# Patient Record
Sex: Male | Born: 1965 | Race: White | Hispanic: No | Marital: Single | Smoking: Never smoker
Health system: Southern US, Community
[De-identification: ages and names within clinical notes are randomized; demographics above are authoritative.]

## PROBLEM LIST (undated history)

## (undated) ENCOUNTER — Emergency Department (HOSPITAL_COMMUNITY): Discharge status: Absent without leave | Payer: Managed Care, Other (non HMO)

## (undated) DIAGNOSIS — E7800 Pure hypercholesterolemia, unspecified: Secondary | ICD-10-CM

## (undated) DIAGNOSIS — K219 Gastro-esophageal reflux disease without esophagitis: Secondary | ICD-10-CM

## (undated) DIAGNOSIS — I10 Essential (primary) hypertension: Secondary | ICD-10-CM

## (undated) HISTORY — PX: APPENDECTOMY: SHX54

## (undated) HISTORY — PX: SPINE SURGERY: SHX786

## (undated) HISTORY — PX: OTHER SURGICAL HISTORY: SHX169

## (undated) HISTORY — DX: Gastro-esophageal reflux disease without esophagitis: K21.9

## (undated) HISTORY — PX: TONSILLECTOMY: SUR1361

---

## 1999-03-05 ENCOUNTER — Ambulatory Visit (HOSPITAL_COMMUNITY): Admission: RE | Admit: 1999-03-05 | Discharge: 1999-03-05 | Payer: Self-pay

## 2000-02-19 ENCOUNTER — Encounter: Payer: Self-pay | Admitting: Emergency Medicine

## 2000-02-19 ENCOUNTER — Emergency Department (HOSPITAL_COMMUNITY): Admission: EM | Admit: 2000-02-19 | Discharge: 2000-02-19 | Payer: Self-pay | Admitting: Emergency Medicine

## 2000-03-05 ENCOUNTER — Emergency Department (HOSPITAL_COMMUNITY): Admission: EM | Admit: 2000-03-05 | Discharge: 2000-03-05 | Payer: Self-pay | Admitting: Emergency Medicine

## 2000-03-05 ENCOUNTER — Encounter: Payer: Self-pay | Admitting: Emergency Medicine

## 2012-01-08 ENCOUNTER — Emergency Department (HOSPITAL_COMMUNITY): Payer: No Typology Code available for payment source

## 2012-01-08 ENCOUNTER — Emergency Department (HOSPITAL_COMMUNITY)
Admission: EM | Admit: 2012-01-08 | Discharge: 2012-01-08 | Disposition: A | Discharge status: Home / Self Care | Payer: No Typology Code available for payment source | Attending: Emergency Medicine | Admitting: Emergency Medicine

## 2012-01-08 ENCOUNTER — Encounter (HOSPITAL_COMMUNITY): Payer: Self-pay | Admitting: Neurology

## 2012-01-08 DIAGNOSIS — M5412 Radiculopathy, cervical region: Secondary | ICD-10-CM | POA: Insufficient documentation

## 2012-01-08 DIAGNOSIS — M5030 Other cervical disc degeneration, unspecified cervical region: Secondary | ICD-10-CM | POA: Insufficient documentation

## 2012-01-08 DIAGNOSIS — R51 Headache: Secondary | ICD-10-CM | POA: Insufficient documentation

## 2012-01-08 DIAGNOSIS — I10 Essential (primary) hypertension: Secondary | ICD-10-CM | POA: Insufficient documentation

## 2012-01-08 DIAGNOSIS — R079 Chest pain, unspecified: Secondary | ICD-10-CM | POA: Insufficient documentation

## 2012-01-08 DIAGNOSIS — F411 Generalized anxiety disorder: Secondary | ICD-10-CM | POA: Insufficient documentation

## 2012-01-08 DIAGNOSIS — M25519 Pain in unspecified shoulder: Secondary | ICD-10-CM | POA: Insufficient documentation

## 2012-01-08 HISTORY — DX: Pure hypercholesterolemia, unspecified: E78.00

## 2012-01-08 HISTORY — DX: Essential (primary) hypertension: I10

## 2012-01-08 MED ORDER — HYDROCODONE-ACETAMINOPHEN 5-325 MG PO TABS
1.0000 | ORAL_TABLET | Freq: Once | ORAL | Status: AC
  Administered 2012-01-08: 1 via ORAL
  Filled 2012-01-08: qty 1

## 2012-01-08 MED ORDER — HYDROCODONE-ACETAMINOPHEN 5-325 MG PO TABS: 1.0000 | ORAL_TABLET | ORAL | Status: DC | PRN

## 2012-01-08 NOTE — ED Notes (Signed)
Spoke with ortho, coming down to apply soft cervical collar.
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
## 2012-01-08 NOTE — Progress Notes (Signed)
Orthopedic Tech Progress Note Patient Details:  Bryce Bush 10-11-1965 [id number]  Ortho Devices Type of Ortho Device: Soft collar Ortho Device/Splint Location: neck Ortho Device/Splint Interventions: Application   Tabor Denham 01/08/2012, 6:14 PM
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
## 2012-01-29 ENCOUNTER — Other Ambulatory Visit: Payer: Self-pay | Admitting: Emergency Medicine

## 2012-01-29 NOTE — Telephone Encounter (Signed)
Chart pulled to PA pool at nurses station [id number]

## 2012-02-26 ENCOUNTER — Other Ambulatory Visit: Payer: Self-pay | Admitting: Physician Assistant

## 2012-02-26 NOTE — Telephone Encounter (Signed)
Chart pulled to PA pool at nurses station [phone]

## 2012-03-11 ENCOUNTER — Ambulatory Visit (INDEPENDENT_AMBULATORY_CARE_PROVIDER_SITE_OTHER): Payer: Managed Care, Other (non HMO) | Admitting: Family Medicine

## 2012-03-11 VITALS — BP 124/84 | HR 68 | Temp 98.1°F | Resp 16 | Ht 69.0 in | Wt 217.0 lb

## 2012-03-11 DIAGNOSIS — E782 Mixed hyperlipidemia: Secondary | ICD-10-CM | POA: Insufficient documentation

## 2012-03-11 DIAGNOSIS — I10 Essential (primary) hypertension: Secondary | ICD-10-CM

## 2012-03-11 DIAGNOSIS — E785 Hyperlipidemia, unspecified: Secondary | ICD-10-CM

## 2012-03-11 HISTORY — DX: Mixed hyperlipidemia: E78.2

## 2012-03-11 HISTORY — DX: Essential (primary) hypertension: I10

## 2012-03-11 LAB — POCT URINALYSIS DIPSTICK
Bilirubin, UA: NEGATIVE
Glucose, UA: NEGATIVE
Ketones, UA: NEGATIVE
Leukocytes, UA: NEGATIVE
Nitrite, UA: NEGATIVE
Protein, UA: NEGATIVE
Spec Grav, UA: 1.025
Urobilinogen, UA: 0.2
pH, UA: 5.5

## 2012-03-11 LAB — COMPREHENSIVE METABOLIC PANEL
ALT: 24 U/L (ref 0–53)
AST: 19 U/L (ref 0–37)
BUN: 11 mg/dL (ref 6–23)
CO2: 29 mEq/L (ref 19–32)
Creat: 0.82 mg/dL (ref 0.50–1.35)
Total Bilirubin: 0.6 mg/dL (ref 0.3–1.2)

## 2012-03-11 LAB — LIPID PANEL
Cholesterol: 258 mg/dL — ABNORMAL HIGH (ref 0–200)
HDL: 46 mg/dL (ref >39)
LDL Cholesterol: 150 mg/dL — ABNORMAL HIGH (ref 0–99)
Total CHOL/HDL Ratio: 5.6 ratio
Triglycerides: 311 mg/dL — ABNORMAL HIGH (ref <150)
VLDL: 62 mg/dL — ABNORMAL HIGH (ref 0–40)

## 2012-03-11 LAB — COMPREHENSIVE METABOLIC PANEL WITH GFR
Albumin: 4.5 g/dL (ref 3.5–5.2)
Alkaline Phosphatase: 52 U/L (ref 39–117)
Calcium: 9.6 mg/dL (ref 8.4–10.5)
Chloride: 102 meq/L (ref 96–112)
Glucose, Bld: 81 mg/dL (ref 70–99)
Potassium: 4.6 meq/L (ref 3.5–5.3)
Sodium: 139 meq/L (ref 135–145)
Total Protein: 7.4 g/dL (ref 6.0–8.3)

## 2012-03-11 LAB — TSH: TSH: 1.735 u[IU]/mL (ref 0.350–4.500)

## 2012-03-11 MED ORDER — LISINOPRIL 20 MG PO TABS: 20.0000 mg | ORAL_TABLET | Freq: Every day | ORAL | Status: DC

## 2012-03-11 MED ORDER — PRAVASTATIN SODIUM 40 MG PO TABS: 40.0000 mg | ORAL_TABLET | Freq: Every day | ORAL | Status: DC

## 2012-03-11 NOTE — Progress Notes (Signed)
[facility]:  Office Visit  Chief Complaint:  Chief Complaint  Patient presents with  . Medication Refill    HPI: Bryce Bush is a 46 y.o. male who complains of: 1. HTN-123/72 last check, was 120s/70-80s. No SEs. Compliant. Last eye exam was nl in the last month.  2. Hyperlipidemia-Compliant, no SEs.   Past Medical History  Diagnosis Date  . Hypertension   . Hypercholesteremia    Past Surgical History  Procedure Date  . Appendectomy   . Tonsillectomy    History   Social History  . Marital Status: Single    Spouse Name: N/A    Number of Children: N/A  . Years of Education: N/A   Social History Main Topics  . Smoking status: Never Smoker   . Smokeless tobacco: Not on file  . Alcohol Use: No  . Drug Use: No  . Sexually Active:    Other Topics Concern  . Not on file   Social History Narrative  . No narrative on file   Family History  Problem Relation Age of Onset  . Cancer Father   . Heart disease Father    No Known Allergies Prior to Admission medications   Medication Sig Start Date End Date Taking? Authorizing Provider  aspirin 81 MG chewable tablet Chew 81 mg by mouth daily.   Yes Historical Provider, MD  fish oil-omega-3 fatty acids 1000 MG capsule Take 1 g by mouth daily.   Yes Historical Provider, MD  lisinopril (PRINIVIL,ZESTRIL) 20 MG tablet TAKE ONE TABLET BY MOUTH EVERY DAY. NEEDS OFFICE VISIT 02/26/12  Yes Ryan M Dunn, PA-C  pravastatin (PRAVACHOL) 40 MG tablet TAKE ONE TABLET BY MOUTH EVERY DAY. NEEDS OFFICE VISIT 02/26/12  Yes Sondra Barges, PA-C  HYDROcodone-acetaminophen (NORCO/VICODIN) 5-325 MG per tablet Take 1 tablet by mouth every 4 (four) hours as needed for pain. 01/08/12   Loren Racer, MD  LYSINE PO Take 1 tablet by mouth daily.    Historical Provider, MD  pravastatin (PRAVACHOL) 10 MG tablet Take 10 mg by mouth daily.    Historical Provider, MD     ROS: The patient denies fevers, chills, night sweats, unintentional  weight loss, chest pain, palpitations, wheezing, dyspnea on exertion, nausea, vomiting, abdominal pain, dysuria, hematuria, melena, numbness, weakness, or tingling.   All other systems have been reviewed and were otherwise negative with the exception of those mentioned in the HPI and as above.    PHYSICAL EXAM: Filed Vitals:   03/11/12 1217  BP: 124/84  Pulse: 68  Temp: 98.1 F (36.7 C)  Resp: 16   Filed Vitals:   03/11/12 1217  Height: 5\' 9"  (1.753 m)  Weight: 217 lb (98.431 kg)   Body mass index is 32.05 kg/(m^2).  General: Alert, no acute distress, obese HEENT:  Normocephalic, atraumatic, oropharynx patent. EOMI, PERRLA, fundoscopic exam nl Cardiovascular:  Regular rate and rhythm, no rubs murmurs or gallops.  No Carotid bruits, radial pulse intact. No pedal edema.  Respiratory: Clear to auscultation bilaterally.  No wheezes, rales, or rhonchi.  No cyanosis, no use of accessory musculature GI: No organomegaly, abdomen is soft and non-tender, positive bowel sounds.  No masses. Skin: No rashes. Neurologic: Facial musculature symmetric. Psychiatric: Patient is appropriate throughout our interaction. Lymphatic: No cervical lymphadenopathy Musculoskeletal: Gait intact.   LABS: No results found for this or any previous visit.   EKG/XRAY:   Primary read interpreted by Dr. Conley Rolls at [facility].   ASSESSMENT/PLAN: Encounter Diagnoses  Name  Primary?  . HTN (hypertension) Yes  . Hyperlipidemia    Weight loss advice given, portion control and increase exercise CMP and lipid panel pending Refill meds x 1 year but advise patient to f/u in 6 months for routine lab work F/u in 6 months   LE, THAO PHUONG, DO 03/11/2012 12:57 PM

## 2012-03-12 ENCOUNTER — Telehealth: Payer: Self-pay | Admitting: Family Medicine

## 2012-03-12 ENCOUNTER — Encounter: Payer: Self-pay | Admitting: Family Medicine

## 2012-03-12 ENCOUNTER — Telehealth: Payer: Self-pay

## 2012-03-12 NOTE — Telephone Encounter (Signed)
LM about labs: cholesterol high on pravachol 40 mg nightly and fish oil. Need to increase to 80 mg and recheck in 3 months. If he has been not taking it regular, which he states he has and has only missed a few doses, then he needs to start taking meds. Missing a few doses does not change cholesterol that significantly. Either way repeat lipid panel in 3 months with change in cholesterol meds/retaking of cholesterol meds.  Decrease fats, increase exercise.

## 2012-03-12 NOTE — Telephone Encounter (Signed)
Message copied by Johnnette Litter on Thu Mar 12, 2012  1:45 PM ------      Message from: Hamilton Capri P      Created: Thu Mar 12, 2012 11:30 AM       Can you pritn out the letter I already wrote him and send it to him. Under letters tab.            Thanks,      Dr. Conley Rolls

## 2012-03-12 NOTE — Telephone Encounter (Signed)
Done

## 2012-05-09 ENCOUNTER — Other Ambulatory Visit: Payer: Self-pay

## 2012-09-30 ENCOUNTER — Ambulatory Visit (INDEPENDENT_AMBULATORY_CARE_PROVIDER_SITE_OTHER): Payer: Managed Care, Other (non HMO) | Admitting: Family Medicine

## 2012-09-30 VITALS — BP 133/82 | HR 65 | Temp 97.8°F | Resp 16 | Ht 67.5 in | Wt 208.6 lb

## 2012-09-30 DIAGNOSIS — R42 Dizziness and giddiness: Secondary | ICD-10-CM

## 2012-09-30 DIAGNOSIS — I10 Essential (primary) hypertension: Secondary | ICD-10-CM

## 2012-09-30 DIAGNOSIS — E785 Hyperlipidemia, unspecified: Secondary | ICD-10-CM

## 2012-09-30 LAB — COMPREHENSIVE METABOLIC PANEL
ALT: 23 U/L (ref 0–53)
AST: 20 U/L (ref 0–37)
Albumin: 4.7 g/dL (ref 3.5–5.2)
Alkaline Phosphatase: 55 U/L (ref 39–117)
Potassium: 4.7 mEq/L (ref 3.5–5.3)
Sodium: 137 mEq/L (ref 135–145)
Total Bilirubin: 0.5 mg/dL (ref 0.3–1.2)
Total Protein: 7.6 g/dL (ref 6.0–8.3)

## 2012-09-30 LAB — COMPREHENSIVE METABOLIC PANEL WITH GFR
BUN: 16 mg/dL (ref 6–23)
CO2: 28 meq/L (ref 19–32)
Calcium: 9.7 mg/dL (ref 8.4–10.5)
Chloride: 101 meq/L (ref 96–112)
Creat: 0.88 mg/dL (ref 0.50–1.35)
Glucose, Bld: 89 mg/dL (ref 70–99)

## 2012-09-30 LAB — POCT URINALYSIS DIPSTICK
Bilirubin, UA: NEGATIVE
Glucose, UA: NEGATIVE
Ketones, UA: NEGATIVE
Leukocytes, UA: NEGATIVE
Nitrite, UA: NEGATIVE
Protein, UA: NEGATIVE
Spec Grav, UA: 1.025
Urobilinogen, UA: 0.2
pH, UA: 5.5

## 2012-09-30 LAB — POCT CBC
Granulocyte percent: 65.2 %G (ref 37–80)
HCT, POC: 47.8 % (ref 43.5–53.7)
Hemoglobin: 15.6 g/dL (ref 14.1–18.1)
Lymph, poc: 2.4 (ref 0.6–3.4)
MCH, POC: 31 pg (ref 27–31.2)
MCHC: 32.6 g/dL (ref 31.8–35.4)
MCV: 95.1 fL (ref 80–97)
MID (cbc): 0.6 (ref 0–0.9)
MPV: 7.1 fL (ref 0–99.8)
POC Granulocyte: 5.6 (ref 2–6.9)
POC LYMPH PERCENT: 27.8 %L (ref 10–50)
POC MID %: 7 %M (ref 0–12)
Platelet Count, POC: 337 10*3/uL (ref 142–424)
RBC: 5.03 M/uL (ref 4.69–6.13)
RDW, POC: 14.3 %
WBC: 8.6 10*3/uL (ref 4.6–10.2)

## 2012-09-30 LAB — LIPID PANEL
Cholesterol: 229 mg/dL — ABNORMAL HIGH (ref 0–200)
HDL: 46 mg/dL (ref >39)
LDL Cholesterol: 123 mg/dL — ABNORMAL HIGH (ref 0–99)
Total CHOL/HDL Ratio: 5 Ratio
Triglycerides: 299 mg/dL — ABNORMAL HIGH (ref <150)
VLDL: 60 mg/dL — ABNORMAL HIGH (ref 0–40)

## 2012-09-30 LAB — POCT UA - MICROSCOPIC ONLY
Bacteria, U Microscopic: NEGATIVE
Casts, Ur, LPF, POC: NEGATIVE
Crystals, Ur, HPF, POC: NEGATIVE
Epithelial cells, urine per micros: NEGATIVE
Mucus, UA: NEGATIVE
Yeast, UA: NEGATIVE

## 2012-09-30 LAB — GLUCOSE, POCT (MANUAL RESULT ENTRY): POC Glucose: 85 mg/dL (ref 70–99)

## 2012-09-30 LAB — TSH: TSH: 2.11 u[IU]/mL (ref 0.350–4.500)

## 2012-09-30 MED ORDER — LISINOPRIL 10 MG PO TABS: 10.0000 mg | ORAL_TABLET | Freq: Every day | ORAL | Status: DC

## 2012-09-30 NOTE — Patient Instructions (Signed)
2000 Calorie Diabetic Diet The 2000 calorie diabetic diet is designed for eating up to 2000 calories each day. Following this diet and making healthy meal choices can help improve overall health. It controls blood glucose (sugar) levels. It can also lower blood pressure and cholesterol. SERVING SIZES Measuring foods and serving sizes helps to make sure you are getting the right amount of food. The list below tells how big or small some common serving sizes are.  1 oz.........4 stacked dice.  3 oz.........Deck of cards.  1 tsp........Tip of little finger.  1 tbs........Thumb.  2 tbs........Golf ball.   cup.......Half of a fist.  1 cup........A fist. GUIDELINES FOR CHOOSING FOODS The goal of this diet is to eat a variety of foods and limit calories to 2000 each day. This can be done by choosing foods that are low in calories and fat. The diet also suggests eating small amounts of food often. Doing this helps control your blood glucose levels so they do not get too high or too low. Each meal or snack should contain a protein food source to help you feel more satisfied and to stabilize your blood glucose. Try to eat about the same amount of food around the same time each day. This includes weekend days, travel days, and days off work. Space your meals about 4 to 5 hours apart and add a snack between them if you wish. For example, a daily food plan could include breakfast, a morning snack, lunch, dinner, and an evening snack. Healthy meals and snacks include whole grains, vegetables, fruits, lean meats, poultry, fish, and dairy products. As you plan your meals, choose a variety of foods. Choose from the bread and starches, vegetables, fruit, dairy, and meat/protein groups. Examples of foods from each group are listed below with their suggested serving sizes. Use measuring cups and spoons to become familiar with what a healthy portion looks like. Bread and Starches Each serving equals 15 grams of  carbohydrates.  1 slice bread.   bagel.   cup or 1 cup cold cereal (unsweetened).   cup hot cereal or mashed potatoes.  1 small potato (size of a computer mouse).   cup cooked pasta or rice.   English muffin.  1 cup broth-based soup.  3 cups popcorn.  4 to 6 whole-wheat crackers.   cup cooked beans, peas, or corn. Vegetables Each serving equals 5 grams of carbohydrates.   cup cooked vegetables.  1 cup raw vegetables.   cup tomato juice. Fruit Each serving equals 15 grams of carbohydrates.  1 small apple, banana, or orange.  1  cup watermelon or strawberries.   cup applesauce (no sugar added).  2 tbs raisins.   banana.   cup unsweetened canned fruit.   cup unsweetened fruit juice. Dairy Each serving equals 12 to 15 grams of carbohydrates.  1 cup fat-free milk.  6 oz artificially sweetened yogurt.  1 cup buttermilk.  1 cup soy milk. Meat/Protein  1 large egg.  2 to 3 oz meat, poultry, or fish.   cup cottage cheese.  1 tbs peanut butter.   cup tofu.  1 oz cheese.   cup tuna canned in water. SAMPLE 2000 CALORIE DIET PLAN Breakfast  1 English muffin (2 carb servings).  Reduced fat cream cheese, 1 tbs.  1 scrambled egg.   grapefruit (1 carb serving).  Fat-free milk, 1 cup (1 carb serving). Morning Snack  Artificially sweetened yogurt, 6 oz (1 carb serving).  2 tbs chopped nuts.  1   small peach (1 carb serving). Lunch  Grilled chicken sandwich.  1 hamburger bun (2 carb servings).  2 oz chicken breast.  1 lettuce leaf.  2 slices tomato.  Reduced fat mayonnaise, 1 tbs.  Carrot sticks, 1 cup.  Celery, 1 cup.  1 small apple (1 carb serving).  Fat-free milk, 1 cup (1 carb serving). Afternoon Snack   cup low-fat cottage cheese.  1  cups strawberries (1 carb serving). Dinner  Steak fajitas.  2 oz lean steak.  1 whole-wheat tortilla, 8 inches (1  carb servings).  Shredded lettuce,   cup.  2 slices tomato.  Salsa,  cup.  Low-fat sour cream, 2 tbs.  Brown rice,  cup (1 carb serving).  1 small orange (1 carb serving). Evening Snack  4 reduced fat whole-wheat crackers (1 carb serving).  1 tbs peanut butter.  12 to 15 grapes (1 carb serving). MEAL PLAN Use this worksheet to help you make a daily meal plan based on the 2000 calorie diabetic diet suggestions. The total amount of carbohydrates in your meal or snack is more important than making sure you include all of the food groups at every meal or snack. If you are using this plan to help you control your blood glucose, you may interchange carbohydrate containing foods (dairy, starches, and fruits). Choose a variety of fresh foods of varying colors and flavors. You can choose from the following foods to build your day's meals:  11 Starches.  4 Vegetables.  3 Fruits.  3 Dairy.  8 oz Meat.  Up to 6 Fats. Your dietician can use this worksheet to help you decide how many servings and what types of foods are right for you. BREAKFAST Food Group and Servings / Food Choice Starches ___________________________________________ Dairy ______________________________________________ Fruit ______________________________________________ Meat ______________________________________________ Fat________________________________________________ LUNCH Food Group and Servings / Food Choice Starch _____________________________________________ Meat ______________________________________________ Vegetables _________________________________________ Fruit ______________________________________________ Dairy______________________________________________ Fat________________________________________________ Aura Fey Food Group and Servings / Food  Choice Starch________________________________________________ Meat_________________________________________________ Fruit__________________________________________________ Linford Arnold Group and Servings / Food Choice Starches ____________________________________________ Meat _______________________________________________ Dairy _______________________________________________ Vegetables __________________________________________ Fruit ________________________________________________ Fat_________________________________________________ Lollie Sails Food Group and Servings / Food Choice Fruit _______________________________________________ Meat _______________________________________________ Starch ______________________________________________ DAILY TOTALS Starches ________________________ Vegetables ______________________ Fruit ___________________________ Dairy ___________________________ Meat ___________________________ Fat _____________________________ Document Released: 10/01/2004 Document Revised: 06/03/2011 Document Reviewed: 10/17/2008 [employer] Patient Information 2014 [employer]. Vegetarian Diets Many foods in the vegetarian diet (nuts, legumes, seeds, whole grains, and fresh fruits and vegetables) contain large amounts of fiber, which give a feeling of fullness (satiation) after meals. Vegetarian eating plans may provide significant health benefits including lowering rates of heart disease, obesity, diabetes, cancer, and high blood pressure. A vegetarian diet is usually low in cholesterol and saturated fat due to the high amount of grains, fruits, and vegetables and the elimination of meats. In addition, a vegetarian diet may have some advantages for people with diabetes. These advantages include:  Helping to maintain normal blood glucose levels.  Promoting a healthy weight.  Improving blood glucose control and insulin response.  Reducing the risk for cardiovascular  disease. TYPES OF VEGETARIAN DIETS Vegans. These are vegetarians who consume only plant food. No animal foods of any kind are eaten. Anyone following such a diet will need to select fortified foods or take a vitamin B12 supplement. Vegans also may need a vitamin D supplement if sun exposure is limited. Some foods are fortified with vitamin D that can be chosen as well.  Lacto-vegetarians. These are vegetarians who eat dairy products (  milk, cheese, and yogurt) in addition to plant foods. They do not eat meat, poultry, fish, or eggs. Lacto-ovo vegetarians. These are vegetarians who eat plant foods, eggs, and dairy products, but do not eat meat, fish, or poultry. LIMITING PROTEINS Meals should be planned to provide adequate sources of proteins as they may be limited in an unbalanced vegetarian diet. Good sources of proteins include beans or legumes, soy products, nuts, seeds, eggs, milk, and cheese. The list below includes food sources of protein. Your Registered Dietitian can help you determine your individual protein needs.  Beans and Peas (Dried and Cooked) Each serving represents 7 to 8 grams of protein.  Black beans, 1 cup.  Black-eyed peas, 1 cup.  [place], 1 cup.  Garbanzo, chickpeas,  cup.  Kidney beans, 1 cup.  Lentils, 1 cup.  Lima beans, 1 cup.  Mung beans, 2 cups.  Navy beans,  cup.  Pinto beans,  cup.  Split peas,  cup.  Split pea soup, 1 cup. Meat Substitutes Each serving represents 7 to 8 grams of protein.  Bacon substitute, 2 tbs.  Hummus,  cup.  Soybeans, cup.  Textured vegetable protein,  oz.  Tofu (2 inch x 2 inch x 1 inch cube),  cup (4 oz). Nuts and Seeds Each serving represents 7 to 8 grams of protein.  Almonds, dry-roasted,  cup (1 oz).  [place] nuts,  cup (1 oz).  Butternuts,  cup (1 oz).  Peanuts, roasted,  cup (1 oz).  Pecans,  cup (1 oz).  Pignolias, pine nuts, 2 tbs.  Pistachios, 60 nuts (1 oz).  Pumpkin or squash seeds,   cup.  Sunflower seeds (no hull),  cup.  Sunflower seeds (with hulls),  cup.  Walnuts,  cup (1 oz). Milk/Milk Substitutes Each serving represents 7 to 8 grams of protein.  Soy milk, fortified, 1 cup.  Goat milk, 1 cup.  Kefir, 1 cup. SAMPLE MEAL PLAN - 2000 CALORIES  Carbohydrate: 276 grams (55% of total calories).  Protein: 95 grams (19% of total calories).  Fat: 60 grams (26% of total calories). Breakfast  Skim milk, 1 cup (8g*).  Orange juice,  cup.  1 slice whole-wheat toast (3g).  Flaked corn cereal,  cup (3g).  Margarine, 2 tsp. Morning snack  1 apple or 6 whole-wheat crackers (3g). Noon meal  Skim milk, 1 cup (8g).  Cottage cheese,  cup (8g).  1 medium orange.  1 pita bread filled with  cup garbanzo spread, chopped tomatoes, onions, and lettuce (15g).  Tahini sauce, 1 tsp. Afternoon snack   banana.  4 light, crispy rye crackers (3g). Evening meal  Green salad with bean sprouts (4g).  Pineapple with juice,  cup.  Spaghetti,  cup (3g).  Lentil spaghetti sauce, 1 cup (3g).  [place] dressing, 1 tbs. Evening snack  Skim milk, 1 cup (8g).  Peanuts,  cup (7g).  Popcorn (lightly salted, no butter), 3 cups (3g). * Grams of protein. Document Released: 03/14/2003 Document Revised: 06/03/2011 Document Reviewed: 08/30/2010 [facility] Patient Information 2014 [place], [place]. DASH Diet The DASH diet stands for "Dietary Approaches to Stop Hypertension." It is a healthy eating plan that has been shown to reduce high blood pressure (hypertension) in as little as 14 days, while also possibly providing other significant health benefits. These other health benefits include reducing the risk of breast cancer after menopause and reducing the risk of type 2 diabetes, heart disease, colon cancer, and stroke. Health benefits also include weight loss and slowing kidney failure in patients  with chronic kidney disease.  DIET GUIDELINES  Limit salt  (sodium). Your diet should contain less than 1500 mg of sodium daily.  Limit refined or processed carbohydrates. Your diet should include mostly whole grains. Desserts and added sugars should be used sparingly.  Include small amounts of heart-healthy fats. These types of fats include nuts, oils, and tub margarine. Limit saturated and trans fats. These fats have been shown to be harmful in the body. CHOOSING FOODS  The following food groups are based on a 2000 calorie diet. See your Registered Dietitian for individual calorie needs. Grains and Grain Products (6 to 8 servings daily)  Eat More Often: Whole-wheat bread, brown rice, whole-grain or wheat pasta, quinoa, popcorn without added fat or salt (air popped).  Eat Less Often: White bread, white pasta, white rice, cornbread. Vegetables (4 to 5 servings daily)  Eat More Often: Fresh, frozen, and canned vegetables. Vegetables may be raw, steamed, roasted, or grilled with a minimal amount of fat.  Eat Less Often/Avoid: Creamed or fried vegetables. Vegetables in a cheese sauce. Fruit (4 to 5 servings daily)  Eat More Often: All fresh, canned (in natural juice), or frozen fruits. Dried fruits without added sugar. One hundred percent fruit juice ( cup [237 mL] daily).  Eat Less Often: Dried fruits with added sugar. Canned fruit in light or heavy syrup. [employer], Fish, and Poultry (2 servings or less daily. One serving is 3 to 4 oz [85-114 g]).  Eat More Often: Ninety percent or leaner ground beef, tenderloin, sirloin. Round cuts of beef, chicken breast, [place] breast. All fish. Grill, bake, or broil your meat. Nothing should be fried.  Eat Less Often/Avoid: Fatty cuts of meat, [place], or chicken leg, thigh, or wing. Fried cuts of meat or fish. Dairy (2 to 3 servings)  Eat More Often: Low-fat or fat-free milk, low-fat plain or light yogurt, reduced-fat or part-skim cheese.  Eat Less Often/Avoid: Milk (whole, 2%).Whole milk yogurt.  Full-fat cheeses. Nuts, Seeds, and Legumes (4 to 5 servings per week)  Eat More Often: All without added salt.  Eat Less Often/Avoid: Salted nuts and seeds, canned beans with added salt. Fats and Sweets (limited)  Eat More Often: Vegetable oils, tub margarines without trans fats, sugar-free gelatin. Mayonnaise and salad dressings.  Eat Less Often/Avoid: Coconut oils, palm oils, butter, stick margarine, cream, half and half, cookies, candy, pie. FOR MORE INFORMATION The Dash Diet Eating Plan: www.dashdiet.org Document Released: 02/28/2011 Document Revised: 06/03/2011 Document Reviewed: 02/28/2011 [facility] Patient Information 2014 [place], [place].
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
## 2012-10-07 ENCOUNTER — Other Ambulatory Visit: Payer: Self-pay | Admitting: Family Medicine

## 2012-10-07 ENCOUNTER — Telehealth: Payer: Self-pay

## 2012-10-07 DIAGNOSIS — E785 Hyperlipidemia, unspecified: Secondary | ICD-10-CM

## 2012-10-07 MED ORDER — PRAVASTATIN SODIUM 80 MG PO TABS: 80.0000 mg | ORAL_TABLET | Freq: Every day | ORAL | Status: DC

## 2012-10-07 NOTE — Telephone Encounter (Signed)
Spoke to patietn about labs, BP and dizziness. Dizziness is improved, He has HA . Told him to take exedrin and moniotr BP.IF continues then need to come in for recheck since HA is ongoing. Will also change his cholesterol meds to pravachol from 40 to 80 mg since XOL not well controlled. F/u in 2-3 months for fasting lipids or sooner as needed for HA/lightheadedness.

## 2012-10-07 NOTE — Telephone Encounter (Signed)
PT STATES DR LE WANTED HIM TO CHECK HIS PRESSURE AND YESTERDAY MORNING IT WAS 114/68 AND LAST NIGHT IT WAS 125/70 AND THIS MORNING IT WAS 112/67, HIS PULSE WAS 71 AND 84 AND 66 HE ALSO HAD LAB WORK DONE AND HASN'T HEARD FROM ANYONE. PLEASE CALL [phone]

## 2013-01-15 ENCOUNTER — Other Ambulatory Visit: Payer: Self-pay | Admitting: Family Medicine

## 2013-01-16 ENCOUNTER — Telehealth: Payer: Self-pay

## 2013-01-16 NOTE — Telephone Encounter (Signed)
Patient needs a refill on Pravastatin. Marilu Favre  [phone]

## 2013-01-17 MED ORDER — PRAVASTATIN SODIUM 80 MG PO TABS: 80.0000 mg | ORAL_TABLET | Freq: Every day | ORAL | Status: DC

## 2013-01-18 ENCOUNTER — Telehealth: Payer: Self-pay

## 2013-01-18 MED ORDER — PRAVASTATIN SODIUM 40 MG PO TABS: 80.0000 mg | ORAL_TABLET | Freq: Every day | ORAL | Status: DC

## 2013-01-18 NOTE — Telephone Encounter (Signed)
Pharm sent request for change to 2 tablets of 40 mg pravastatin d/t cost instead of 80 mg tabs. Resending changed Rx.

## 2013-01-27 ENCOUNTER — Encounter: Payer: Self-pay | Admitting: Family Medicine

## 2013-01-27 ENCOUNTER — Ambulatory Visit: Payer: Managed Care, Other (non HMO) | Admitting: Family Medicine

## 2013-01-27 VITALS — BP 110/88 | HR 68 | Temp 98.0°F | Resp 16 | Ht 68.0 in | Wt 216.0 lb

## 2013-01-27 DIAGNOSIS — I10 Essential (primary) hypertension: Secondary | ICD-10-CM

## 2013-01-27 DIAGNOSIS — Z0289 Encounter for other administrative examinations: Secondary | ICD-10-CM

## 2013-01-27 DIAGNOSIS — E785 Hyperlipidemia, unspecified: Secondary | ICD-10-CM

## 2013-01-27 DIAGNOSIS — Z024 Encounter for examination for driving license: Secondary | ICD-10-CM

## 2013-01-27 LAB — COMPREHENSIVE METABOLIC PANEL
ALT: 25 U/L (ref 0–53)
Albumin: 4.4 g/dL (ref 3.5–5.2)
Alkaline Phosphatase: 48 U/L (ref 39–117)
Potassium: 4.5 mEq/L (ref 3.5–5.3)
Sodium: 137 mEq/L (ref 135–145)
Total Bilirubin: 0.5 mg/dL (ref 0.3–1.2)
Total Protein: 7.2 g/dL (ref 6.0–8.3)

## 2013-01-27 LAB — LIPID PANEL
LDL Cholesterol: 118 mg/dL — ABNORMAL HIGH (ref 0–99)
VLDL: 58 mg/dL — ABNORMAL HIGH (ref 0–40)

## 2013-01-27 MED ORDER — LISINOPRIL 10 MG PO TABS: 10.0000 mg | ORAL_TABLET | Freq: Every day | ORAL | Status: DC

## 2013-01-27 MED ORDER — PRAVASTATIN SODIUM 80 MG PO TABS: ORAL_TABLET | ORAL | Status: DC

## 2013-01-27 NOTE — Patient Instructions (Addendum)
Same meds   Return in 6 months

## 2013-01-27 NOTE — Progress Notes (Signed)
Subjective: 47 year old man with history of hypertension and hyperlipidemia here for a refill of his medications. He also is here for a DOT examination. He usually gets those elsewhere but his pulse is sick and he wasn't sure when he would be able to get it. No other major complaints. Review of systems unremarkable.  Objective: Moderately overweight male in no acute distress. TMs normal. Eyes PERRLA. Fundi benign. EOMs intact. Throat clear. Neck supple without nodes or thyromegaly. No carotid bruits. Chest clear to auscultation. Heart regular without murmurs gallops or arrhythmias. Abdomen soft without mass or tenderness. Normal male external genitalia with testes descended. No hernias. Extremities unremarkable. Skin unremarkable.  Assessment: Hypertension Hyperlipidemia DOT exam  Plan: Talk to him about the need to get the DOT exam done separate from the other tests in the future. When your current. Refill of medications and return in 6 months.

## 2013-01-28 ENCOUNTER — Other Ambulatory Visit: Payer: Self-pay

## 2013-01-28 ENCOUNTER — Encounter: Payer: Self-pay | Admitting: Family Medicine

## 2013-02-15 ENCOUNTER — Telehealth: Payer: Self-pay

## 2013-02-15 DIAGNOSIS — E785 Hyperlipidemia, unspecified: Secondary | ICD-10-CM

## 2013-02-15 MED ORDER — PRAVASTATIN SODIUM 80 MG PO TABS: ORAL_TABLET | ORAL | Status: DC

## 2013-02-15 NOTE — Telephone Encounter (Signed)
Called him number given not valid. Left message on home number.

## 2013-02-15 NOTE — Telephone Encounter (Signed)
PT STATES HE ONLY HAVE 1 CHOL PILL LEFT AND WHEN HE CALLED THE PHARMACY WAS TOLD HE HAD TO COME BACK IN, BUT HE WAS JUST HERE LESS THAN 2 WEEKS AGO PLEASE CALL [phone]   [facility] [address]

## 2013-02-16 ENCOUNTER — Telehealth: Payer: Self-pay

## 2013-02-16 DIAGNOSIS — E785 Hyperlipidemia, unspecified: Secondary | ICD-10-CM

## 2013-02-16 MED ORDER — PRAVASTATIN SODIUM 40 MG PO TABS: 80.0000 mg | ORAL_TABLET | Freq: Every day | ORAL | Status: DC

## 2013-02-16 NOTE — Telephone Encounter (Signed)
Pharm reqs change to 40 mg tablets, 2 tabs QD for lower cost for pt. Sending in new Rx.

## 2013-07-27 ENCOUNTER — Other Ambulatory Visit: Payer: Self-pay | Admitting: Family Medicine

## 2013-08-25 ENCOUNTER — Ambulatory Visit: Payer: Managed Care, Other (non HMO) | Admitting: Family Medicine

## 2013-08-25 VITALS — BP 130/86 | HR 71 | Temp 98.4°F | Resp 18 | Ht 67.5 in | Wt 216.4 lb

## 2013-08-25 DIAGNOSIS — R635 Abnormal weight gain: Secondary | ICD-10-CM

## 2013-08-25 DIAGNOSIS — E7800 Pure hypercholesterolemia, unspecified: Secondary | ICD-10-CM

## 2013-08-25 DIAGNOSIS — I10 Essential (primary) hypertension: Secondary | ICD-10-CM

## 2013-08-25 LAB — CBC
HCT: 44.9 % (ref 39.0–52.0)
Hemoglobin: 15.5 g/dL (ref 13.0–17.0)
MCH: 30.5 pg (ref 26.0–34.0)
MCHC: 34.5 g/dL (ref 30.0–36.0)
MCV: 88.4 fL (ref 78.0–100.0)
PLATELETS: 273 10*3/uL (ref 150–400)
RBC: 5.08 MIL/uL (ref 4.22–5.81)
RDW: 14 % (ref 11.5–15.5)
WBC: 7.7 10*3/uL (ref 4.0–10.5)

## 2013-08-25 LAB — COMPREHENSIVE METABOLIC PANEL
ALT: 25 U/L (ref 0–53)
AST: 19 U/L (ref 0–37)
Albumin: 4.6 g/dL (ref 3.5–5.2)
Alkaline Phosphatase: 48 U/L (ref 39–117)
BILIRUBIN TOTAL: 0.6 mg/dL (ref 0.2–1.2)
BUN: 12 mg/dL (ref 6–23)
CO2: 28 mEq/L (ref 19–32)
CREATININE: 0.88 mg/dL (ref 0.50–1.35)
Calcium: 9.8 mg/dL (ref 8.4–10.5)
Chloride: 102 mEq/L (ref 96–112)
Glucose, Bld: 81 mg/dL (ref 70–99)
Potassium: 4.6 mEq/L (ref 3.5–5.3)
Sodium: 138 mEq/L (ref 135–145)
Total Protein: 7.5 g/dL (ref 6.0–8.3)

## 2013-08-25 LAB — LIPID PANEL
CHOL/HDL RATIO: 4.2 ratio
Cholesterol: 213 mg/dL — ABNORMAL HIGH (ref 0–200)
HDL: 51 mg/dL (ref >39)
LDL CALC: 105 mg/dL — AB (ref 0–99)
TRIGLYCERIDES: 286 mg/dL — AB (ref <150)
VLDL: 57 mg/dL — ABNORMAL HIGH (ref 0–40)

## 2013-08-25 LAB — TSH: TSH: 1.599 u[IU]/mL (ref 0.350–4.500)

## 2013-08-25 MED ORDER — PRAVASTATIN SODIUM 80 MG PO TABS: 80.0000 mg | ORAL_TABLET | Freq: Every day | ORAL | Status: DC

## 2013-08-25 MED ORDER — LISINOPRIL 10 MG PO TABS: 10.0000 mg | ORAL_TABLET | Freq: Every day | ORAL | Status: DC

## 2013-08-25 NOTE — Patient Instructions (Signed)
Continue to take your medications as directed.  Work on cutting back on calories and try to get some more exercise.  Try to get 45- 60 minutes of exercise most days of the week (you can build up to this slowly of course).   I will be in touch with your labs asap

## 2013-08-25 NOTE — Progress Notes (Signed)
[facility] [address], [place] [address] [phone]  Date:  08/25/2013   Name:  Bryce Bush   DOB:  October 10, 1965   MRN:  [id number]  PCP:  Pcp Not In System    Chief Complaint: Follow-up and Hyperlipidemia   History of Present Illness:  Bryce Bush is a 48 y.o. very pleasant male patient who presents with the following:  Here today to follow-up medications and labs.  He is fasting today.  He would like to check a CHL panel  He would like to lose weight but admits he does not exrercise much and that "food is my only vice, I just can't give it up."  He wants to know if he can take a medication to lose weight instead.   He is generally healthy except for weight and high cholesterol  Patient Active Problem List   Diagnosis Date Noted  . HTN (hypertension) 03/11/2012  . Hyperlipidemia 03/11/2012    Past Medical History  Diagnosis Date  . Hypertension   . Hypercholesteremia     Past Surgical History  Procedure Laterality Date  . Appendectomy    . Tonsillectomy    . Spine surgery      History  Substance Use Topics  . Smoking status: Never Smoker   . Smokeless tobacco: Not on file  . Alcohol Use: No    Family History  Problem Relation Age of Onset  . Cancer Father   . Heart disease Father     No Known Allergies  Medication list has been reviewed and updated.  Current Outpatient Prescriptions on File Prior to Visit  Medication Sig Dispense Refill  . aspirin 81 MG chewable tablet Chew 81 mg by mouth daily.      . fish oil-omega-3 fatty acids 1000 MG capsule Take 1 g by mouth daily.      Marland Kitchen lisinopril (PRINIVIL,ZESTRIL) 10 MG tablet Take 1 tablet (10 mg total) by mouth daily. PATIENT NEEDS AN OFFICE VISIT FOR ADDITIONAL REFILLS.  30 tablet  0  . LYSINE PO Take 1 tablet by mouth daily.      . pravastatin (PRAVACHOL) 40 MG tablet Take 2 tablets (80 mg total) by mouth daily.  60 tablet  5   No current facility-administered medications on  file prior to visit.    Review of Systems:  As per HPI- otherwise negative.   Physical Examination: Filed Vitals:   08/25/13 0924  BP: 130/86  Pulse: 71  Temp: 98.4 F (36.9 C)  Resp: 18   Filed Vitals:   08/25/13 0924  Height: 5' 7.5" (1.715 m)  Weight: 216 lb 6.4 oz (98.158 kg)   Body mass index is 33.37 kg/(m^2). Ideal Body Weight: Weight in (lb) to have BMI = 25: 161.7  GEN: WDWN, NAD, Non-toxic, A & O x 3, obese HEENT: Atraumatic, Normocephalic. Neck supple. No masses, No LAD. Ears and Nose: No external deformity. CV: RRR, No M/G/R. No JVD. No thrill. No extra heart sounds. PULM: CTA B, no wheezes, crackles, rhonchi. No retractions. No resp. distress. No accessory muscle use. EXTR: No c/c/e NEURO Normal gait.  PSYCH: Normally interactive. Conversant. Not depressed or anxious appearing.  Calm demeanor.    Assessment and Plan: High cholesterol - Plan: pravastatin (PRAVACHOL) 80 MG tablet, Lipid panel  HTN (hypertension) - Plan: lisinopril (PRINIVIL,ZESTRIL) 10 MG tablet, CBC, Comprehensive metabolic panel  Weight gain - Plan: TSH  Discussed lack of a medication that will guarantee sustained weight loss.  Encouraged better diet and more exercise BP ok See patient instructions for more details.     Signed Lamar Blinks, MD

## 2013-12-22 ENCOUNTER — Ambulatory Visit (INDEPENDENT_AMBULATORY_CARE_PROVIDER_SITE_OTHER): Payer: Managed Care, Other (non HMO) | Admitting: Family Medicine

## 2013-12-22 VITALS — BP 122/78 | HR 97 | Temp 98.2°F | Resp 18 | Ht 68.0 in | Wt 221.0 lb

## 2013-12-22 DIAGNOSIS — E785 Hyperlipidemia, unspecified: Secondary | ICD-10-CM

## 2013-12-22 DIAGNOSIS — Z23 Encounter for immunization: Secondary | ICD-10-CM

## 2013-12-22 DIAGNOSIS — I10 Essential (primary) hypertension: Secondary | ICD-10-CM

## 2013-12-22 DIAGNOSIS — Z0000 Encounter for general adult medical examination without abnormal findings: Secondary | ICD-10-CM

## 2013-12-22 LAB — COMPREHENSIVE METABOLIC PANEL
ALBUMIN: 4.3 g/dL (ref 3.5–5.2)
ALK PHOS: 55 U/L (ref 39–117)
ALT: 23 U/L (ref 0–53)
AST: 16 U/L (ref 0–37)
BUN: 14 mg/dL (ref 6–23)
CALCIUM: 9.4 mg/dL (ref 8.4–10.5)
CHLORIDE: 103 meq/L (ref 96–112)
CO2: 26 mEq/L (ref 19–32)
Creat: 0.85 mg/dL (ref 0.50–1.35)
Glucose, Bld: 83 mg/dL (ref 70–99)
POTASSIUM: 4.4 meq/L (ref 3.5–5.3)
SODIUM: 140 meq/L (ref 135–145)
TOTAL PROTEIN: 6.9 g/dL (ref 6.0–8.3)
Total Bilirubin: 0.5 mg/dL (ref 0.2–1.2)

## 2013-12-22 LAB — POCT URINALYSIS DIPSTICK
Bilirubin, UA: NEGATIVE
GLUCOSE UA: NEGATIVE
KETONES UA: NEGATIVE
LEUKOCYTES UA: NEGATIVE
Nitrite, UA: NEGATIVE
Protein, UA: NEGATIVE
Spec Grav, UA: 1.02
Urobilinogen, UA: 0.2
pH, UA: 6

## 2013-12-22 LAB — POCT UA - MICROSCOPIC ONLY
BACTERIA, U MICROSCOPIC: NEGATIVE
Casts, Ur, LPF, POC: NEGATIVE
Crystals, Ur, HPF, POC: NEGATIVE
MUCUS UA: NEGATIVE
Yeast, UA: NEGATIVE

## 2013-12-22 LAB — POCT CBC
GRANULOCYTE PERCENT: 77.8 % (ref 37–80)
HCT, POC: 48 % (ref 43.5–53.7)
HEMOGLOBIN: 15.8 g/dL (ref 14.1–18.1)
Lymph, poc: 2 (ref 0.6–3.4)
MCH: 30.4 pg (ref 27–31.2)
MCHC: 32.9 g/dL (ref 31.8–35.4)
MCV: 92.3 fL (ref 80–97)
MID (cbc): 0.6 (ref 0–0.9)
MPV: 5.8 fL (ref 0–99.8)
POC Granulocyte: 9.1 — AB (ref 2–6.9)
POC LYMPH PERCENT: 17.1 %L (ref 10–50)
POC MID %: 5.1 % (ref 0–12)
Platelet Count, POC: 319 10*3/uL (ref 142–424)
RBC: 5.2 M/uL (ref 4.69–6.13)
RDW, POC: 13.4 %
WBC: 11.7 10*3/uL — AB (ref 4.6–10.2)

## 2013-12-22 LAB — TSH: TSH: 2.128 u[IU]/mL (ref 0.350–4.500)

## 2013-12-22 LAB — LIPID PANEL
Cholesterol: 195 mg/dL (ref 0–200)
HDL: 49 mg/dL (ref >39)
LDL CALC: 101 mg/dL — AB (ref 0–99)
Total CHOL/HDL Ratio: 4 Ratio
Triglycerides: 223 mg/dL — ABNORMAL HIGH (ref <150)
VLDL: 45 mg/dL — ABNORMAL HIGH (ref 0–40)

## 2013-12-22 LAB — POCT GLYCOSYLATED HEMOGLOBIN (HGB A1C): Hemoglobin A1C: 5.5

## 2013-12-22 LAB — IFOBT (OCCULT BLOOD): IFOBT: POSITIVE

## 2013-12-22 MED ORDER — LISINOPRIL 10 MG PO TABS: 10.0000 mg | ORAL_TABLET | Freq: Every day | ORAL | Status: DC

## 2013-12-22 MED ORDER — ATORVASTATIN CALCIUM 40 MG PO TABS: 40.0000 mg | ORAL_TABLET | Freq: Every day | ORAL | Status: DC

## 2013-12-22 NOTE — Patient Instructions (Addendum)
Return in 6 months for followup, sooner if needed.  Changed you from pravastatin to atorvastatin. We will see how you do with that.  Work on regular weight loss and exercise.  Return sooner if problems.

## 2013-12-22 NOTE — Progress Notes (Signed)
Annual physical examination:  History: 48 year old man who is here for his physical examination. He's been doing well. No major acute medical complaints.  Past history: Surgeries: Cervical disc repair Major medical illnesses: None. History of hyperlipidemia and possible hypertension Medical allergies: None Regular medications: Percocet and, lisinopril  Social history: Patient is single, works as a Industrial/product designer. Father is deceased at 62 years of age from cancer and congestive heart failure. Mother is alive and well. Brother has Graves' disease. Has no children  Review of systems: HEENT: Unremarkable. Reads with glasses. Cardiovascular: Unremarkable Respiratory: Unremarkable Constitutional: Unremarkable Gastrointestinal: Unremarkable Genitourinary: Unremarkable Endocrinologic: Unremarkable Neurologic: Unremarkable Psychiatric: Unremarkable Dermatologic: Unremarkable Hematologic: Unremarkable  Physical examination: Overweight white male in no major distress. Discussed weight with him. His TMs are normal. Eyes PERRLA. Fundi normal. Throat clear. Neck supple without nodes or thyromegaly. No carotid bruits. Chest is clear to auscultation. Heart regular without murmurs gallops or arrhythmias. Abdomen soft without masses tenderness. Normal male external genitalia, testes descended. No hernias. Digital rectal exam his prostate gland to be normal in size and shape. Extremities unremarkable. Skin unremarkable. He does have a large neck but denies any symptoms consistent with sleep apnea. No excessive daytime drowsiness.  Assessment: Normal physical examination History of hyperlipidemia History for hypertension Obesity  Plan: Check a baseline EKG, and some basic labs. Return in about 6 months for followup.  Results for orders placed in visit on 12/22/13  POCT CBC      Result Value Ref Range   WBC 11.7 (*) 4.6 - 10.2 K/uL   Lymph, poc 2.0  0.6 - 3.4   POC LYMPH  PERCENT 17.1  10 - 50 %L   MID (cbc) 0.6  0 - 0.9   POC MID % 5.1  0 - 12 %M   POC Granulocyte 9.1 (*) 2 - 6.9   Granulocyte percent 77.8  37 - 80 %G   RBC 5.20  4.69 - 6.13 M/uL   Hemoglobin 15.8  14.1 - 18.1 g/dL   HCT, POC 48.0  43.5 - 53.7 %   MCV 92.3  80 - 97 fL   MCH, POC 30.4  27 - 31.2 pg   MCHC 32.9  31.8 - 35.4 g/dL   RDW, POC 13.4     Platelet Count, POC 319  142 - 424 K/uL   MPV 5.8  0 - 99.8 fL  POCT GLYCOSYLATED HEMOGLOBIN (HGB A1C)      Result Value Ref Range   Hemoglobin A1C 5.5    IFOBT (OCCULT BLOOD)      Result Value Ref Range   IFOBT Positive    POCT URINALYSIS DIPSTICK      Result Value Ref Range   Color, UA yellow     Clarity, UA clear     Glucose, UA neg     Bilirubin, UA neg     Ketones, UA neg     Spec Grav, UA 1.020     Blood, UA moderate     pH, UA 6.0     Protein, UA neg     Urobilinogen, UA 0.2     Nitrite, UA neg     Leukocytes, UA Negative    POCT UA - MICROSCOPIC ONLY      Result Value Ref Range   WBC, Ur, HPF, POC 0-1     RBC, urine, microscopic 2-4     Bacteria, U Microscopic neg     Mucus, UA neg  Epithelial cells, urine per micros 0-1     Crystals, Ur, HPF, POC neg     Casts, Ur, LPF, POC neg     Yeast, UA neg      Normal ekg  Will continue current medications except for the change in the cholesterol med. Return as needed

## 2014-01-12 ENCOUNTER — Ambulatory Visit (INDEPENDENT_AMBULATORY_CARE_PROVIDER_SITE_OTHER): Payer: Self-pay | Admitting: Emergency Medicine

## 2014-01-12 VITALS — BP 122/86 | HR 67 | Temp 98.1°F | Resp 16 | Ht 68.0 in | Wt 217.0 lb

## 2014-01-12 DIAGNOSIS — Z0289 Encounter for other administrative examinations: Secondary | ICD-10-CM

## 2014-01-12 NOTE — Progress Notes (Signed)
[facility] [address], [place] [address] [phone]  Date:  01/12/2014   Name:  Bryce Bush   DOB:  11-Dec-1965   MRN:  [id number]  PCP:  Pcp Not In System    Chief Complaint: Employment Physical   History of Present Illness:  Bryce Bush is a 48 y.o. very pleasant male patient who presents with the following:  DOT physical   Patient Active Problem List   Diagnosis Date Noted  . HTN (hypertension) 03/11/2012  . Hyperlipidemia 03/11/2012    Past Medical History  Diagnosis Date  . Hypertension   . Hypercholesteremia     Past Surgical History  Procedure Laterality Date  . Appendectomy    . Tonsillectomy    . Spine surgery      History  Substance Use Topics  . Smoking status: Never Smoker   . Smokeless tobacco: Not on file  . Alcohol Use: No    Family History  Problem Relation Age of Onset  . Cancer Father   . Heart disease Father     No Known Allergies  Medication list has been reviewed and updated.  Current Outpatient Prescriptions on File Prior to Visit  Medication Sig Dispense Refill  . aspirin 81 MG chewable tablet Chew 81 mg by mouth daily.      Marland Kitchen atorvastatin (LIPITOR) 40 MG tablet Take 1 tablet (40 mg total) by mouth daily.  90 tablet  3  . fish oil-omega-3 fatty acids 1000 MG capsule Take 1 g by mouth daily.      Marland Kitchen lisinopril (PRINIVIL,ZESTRIL) 10 MG tablet Take 1 tablet (10 mg total) by mouth daily.  90 tablet  3  . LYSINE PO Take 1 tablet by mouth daily.       No current facility-administered medications on file prior to visit.    Review of Systems:  As per HPI, otherwise negative.    Physical Examination: Filed Vitals:   01/12/14 0841  BP: 122/86  Pulse: 67  Temp: 98.1 F (36.7 C)  Resp: 16   Filed Vitals:   01/12/14 0841  Height: 5\' 8"  (1.727 m)  Weight: 217 lb (98.431 kg)   Body mass index is 33 kg/(m^2). Ideal Body Weight: Weight in (lb) to have BMI = 25: 164.1  GEN: WDWN, NAD, Non-toxic,  A & O x 3 HEENT: Atraumatic, Normocephalic. Neck supple. No masses, No LAD. Ears and Nose: No external deformity. CV: RRR, No M/G/R. No JVD. No thrill. No extra heart sounds. PULM: CTA B, no wheezes, crackles, rhonchi. No retractions. No resp. distress. No accessory muscle use. ABD: S, NT, ND, +BS. No rebound. No HSM. EXTR: No c/c/e NEURO Normal gait.  PSYCH: Normally interactive. Conversant. Not depressed or anxious appearing.  Calm demeanor.    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

## 2014-07-06 ENCOUNTER — Telehealth: Payer: Self-pay

## 2014-07-06 NOTE — Telephone Encounter (Signed)
Patient was seen on 12/22/2013 for a CPE (and other things) with Dr. Linna Darner. Patient wants this info on a letterhead and sent to his HR department. Just a message that says what date and which doctor the patient saw for his CPE. Please fax to: [phone] Bryce Bush.   Please call patient when this has been done. [phone]

## 2014-07-06 NOTE — Telephone Encounter (Signed)
Faxed letter to number given. Called pt to let him know.

## 2014-07-06 NOTE — Telephone Encounter (Signed)
Letter printed awaiting signature.

## 2014-11-02 ENCOUNTER — Ambulatory Visit (INDEPENDENT_AMBULATORY_CARE_PROVIDER_SITE_OTHER): Payer: Managed Care, Other (non HMO) | Admitting: Family Medicine

## 2014-11-02 VITALS — BP 104/76 | HR 75 | Temp 98.5°F | Resp 14 | Ht 69.0 in | Wt 197.0 lb

## 2014-11-02 DIAGNOSIS — E785 Hyperlipidemia, unspecified: Secondary | ICD-10-CM | POA: Diagnosis not present

## 2014-11-02 DIAGNOSIS — Z0000 Encounter for general adult medical examination without abnormal findings: Secondary | ICD-10-CM

## 2014-11-02 DIAGNOSIS — Z23 Encounter for immunization: Secondary | ICD-10-CM

## 2014-11-02 DIAGNOSIS — I10 Essential (primary) hypertension: Secondary | ICD-10-CM | POA: Diagnosis not present

## 2014-11-02 DIAGNOSIS — Z130 Encounter for screening for diseases of the blood and blood-forming organs and certain disorders involving the immune mechanism: Secondary | ICD-10-CM

## 2014-11-02 DIAGNOSIS — Z1211 Encounter for screening for malignant neoplasm of colon: Secondary | ICD-10-CM

## 2014-11-02 LAB — LIPID PANEL
CHOL/HDL RATIO: 3 ratio (ref <=5.0)
CHOLESTEROL: 156 mg/dL (ref 125–200)
HDL: 52 mg/dL (ref >=40)
LDL CALC: 88 mg/dL (ref <130)
TRIGLYCERIDES: 82 mg/dL (ref <150)
VLDL: 16 mg/dL (ref <30)

## 2014-11-02 LAB — COMPLETE METABOLIC PANEL WITH GFR
ALK PHOS: 55 U/L (ref 40–115)
ALT: 18 U/L (ref 9–46)
AST: 15 U/L (ref 10–35)
Albumin: 4.3 g/dL (ref 3.6–5.1)
BILIRUBIN TOTAL: 0.5 mg/dL (ref 0.2–1.2)
BUN: 20 mg/dL (ref 7–25)
CO2: 25 mmol/L (ref 20–31)
Calcium: 9 mg/dL (ref 8.6–10.3)
Chloride: 105 mmol/L (ref 98–110)
Creat: 0.79 mg/dL (ref 0.70–1.33)
Glucose, Bld: 92 mg/dL (ref 65–99)
Potassium: 4.4 mmol/L (ref 3.5–5.3)
SODIUM: 141 mmol/L (ref 135–146)
Total Protein: 6.9 g/dL (ref 6.1–8.1)

## 2014-11-02 LAB — CBC
HCT: 43 % (ref 39.0–52.0)
HEMOGLOBIN: 14.6 g/dL (ref 13.0–17.0)
MCH: 30 pg (ref 26.0–34.0)
MCHC: 34 g/dL (ref 30.0–36.0)
MCV: 88.3 fL (ref 78.0–100.0)
MPV: 8.3 fL — ABNORMAL LOW (ref 8.6–12.4)
Platelets: 250 10*3/uL (ref 150–400)
RBC: 4.87 MIL/uL (ref 4.22–5.81)
RDW: 13.7 % (ref 11.5–15.5)
WBC: 6.7 10*3/uL (ref 4.0–10.5)

## 2014-11-02 MED ORDER — ATORVASTATIN CALCIUM 40 MG PO TABS: 40.0000 mg | ORAL_TABLET | Freq: Every day | ORAL | Status: DC

## 2014-11-02 MED ORDER — LISINOPRIL 10 MG PO TABS: 10.0000 mg | ORAL_TABLET | Freq: Every day | ORAL | Status: DC
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
## 2014-11-02 NOTE — Patient Instructions (Signed)
You should receive a call or letter about your lab results within the next week to 10 days. Continue same meds for now.  We will call you about referral for colon cancer screening (colonoscopy).  Schedule dentist visit.  Let me know if you have any issues with your toenail - you can try over the counter antifungal application first if you would like.   Keeping you healthy  Get these tests  Blood pressure- Have your blood pressure checked once a year by your healthcare provider.  Normal blood pressure is 120/80  Weight- Have your body mass index (BMI) calculated to screen for obesity.  BMI is a measure of body fat based on height and weight. You can also calculate your own BMI at [email or web address].  Cholesterol- Have your cholesterol checked every year.  Diabetes- Have your blood sugar checked regularly if you have high blood pressure, high cholesterol, have a family history of diabetes or if you are overweight.  Screening for Colon Cancer- Colonoscopy starting at age 43.  Screening may begin sooner depending on your family history and other health conditions. Follow up colonoscopy as directed by your Gastroenterologist.  Screening for Prostate Cancer- Both blood work (PSA) and a rectal exam help screen for Prostate Cancer.  Screening begins at age 45 with African-American men and at age 86 with Caucasian men.  Screening may begin sooner depending on your family history.  Take these medicines  Aspirin- One aspirin daily can help prevent Heart disease and Stroke.  Flu shot- Every fall.  Tetanus- Every 10 years.  Zostavax- Once after the age of 7 to prevent Shingles.  Pneumonia shot- Once after the age of 55; if you are younger than 53, ask your healthcare provider if you need a Pneumonia shot.  Take these steps  Don't smoke- If you do smoke, talk to your doctor about quitting.  For tips on how to quit, go to www.smokefree.gov or call 1-800-QUIT-NOW.  Be physically active-  Exercise 5 days a week for at least 30 minutes.  If you are not already physically active start slow and gradually work up to 30 minutes of moderate physical activity.  Examples of moderate activity include walking briskly, mowing the yard, dancing, swimming, bicycling, etc.  Eat a healthy diet- Eat a variety of healthy food such as fruits, vegetables, low fat milk, low fat cheese, yogurt, lean meant, poultry, fish, beans, tofu, etc. For more information go to www.thenutritionsource.org  Drink alcohol in moderation- Limit alcohol intake to less than two drinks a day. Never drink and drive.  Dentist- Brush and floss twice daily; visit your dentist twice a year.  Depression- Your emotional health is as important as your physical health. If you're feeling down, or losing interest in things you would normally enjoy please talk to your healthcare provider.  Eye exam- Visit your eye doctor every year.  Safe sex- If you may be exposed to a sexually transmitted infection, use a condom.  Seat belts- Seat belts can save your life; always wear one.  Smoke/Carbon Monoxide detectors- These detectors need to be installed on the appropriate level of your home.  Replace batteries at least once a year.  Skin cancer- When out in the sun, cover up and use sunscreen 15 SPF or higher.  Violence- If anyone is threatening you, please tell your healthcare provider.  Living Will/ Health care power of attorney- Speak with your healthcare provider and family.

## 2014-11-08 ENCOUNTER — Encounter: Payer: Self-pay | Admitting: Gastroenterology

## 2015-01-04 ENCOUNTER — Ambulatory Visit (AMBULATORY_SURGERY_CENTER): Payer: Self-pay | Admitting: *Deleted

## 2015-01-04 ENCOUNTER — Ambulatory Visit (INDEPENDENT_AMBULATORY_CARE_PROVIDER_SITE_OTHER): Payer: Managed Care, Other (non HMO) | Admitting: Emergency Medicine

## 2015-01-04 VITALS — Ht 69.0 in | Wt 195.0 lb

## 2015-01-04 VITALS — BP 106/78 | HR 60 | Temp 98.6°F | Resp 18 | Ht 67.5 in | Wt 193.0 lb

## 2015-01-04 DIAGNOSIS — Z1211 Encounter for screening for malignant neoplasm of colon: Secondary | ICD-10-CM

## 2015-01-04 DIAGNOSIS — Z0000 Encounter for general adult medical examination without abnormal findings: Secondary | ICD-10-CM

## 2015-01-04 DIAGNOSIS — Z021 Encounter for pre-employment examination: Secondary | ICD-10-CM

## 2015-01-04 MED ORDER — MOVIPREP 100 G PO SOLR: ORAL | Status: DC

## 2015-01-04 NOTE — Progress Notes (Signed)
Patient ID: Bryce Bush, male   DOB: [birth year], 49 y.o.   MRN: [id number]    By signing my name below, I, Bryce Bush, attest that this documentation has been prepared under the direction and in the presence of Bryce Russian, MD Electronically Signed: Ladene Artist, ED Scribe 01/04/2015 at 12:29 PM.  Chief Complaint:  Chief Complaint  Patient presents with  . Employment Physical    DOT PE   HPI: Bryce Bush is a 49 y.o. male, with a h/o HTN and hypercholesteremia, who reports to [facility] today for a DOT physical examination. Pt states that he is compliant with Lisinopril and Lipitor. He also reports neck surgery in 2013. Pt wears corrective lenses but denies any other license restrictions. No complaints reported at this visit.   Past Medical History  Diagnosis Date  . Hypertension   . Hypercholesteremia    Past Surgical History  Procedure Laterality Date  . Appendectomy    . Tonsillectomy    . Spine surgery     Social History   Social History  . Marital Status: Single    Spouse Name: Bryce Bush  . Number of Children: Bryce Bush  . Years of Education: Bryce Bush   Social History Main Topics  . Smoking status: Never Smoker   . Smokeless tobacco: Never Used  . Alcohol Use: No  . Drug Use: No  . Sexual Activity: Not Asked   Other Topics Concern  . None   Social History Narrative   Single. Education: [employer].   Family History  Problem Relation Age of Onset  . Cancer Father 51    throat  . Heart disease Father   . Colon cancer Neg Hx    No Known Allergies Prior to Admission medications   Medication Sig Start Date End Date Taking? Authorizing Provider  aspirin 81 MG chewable tablet Chew 81 mg by mouth daily.   Yes Historical Provider, MD  atorvastatin (LIPITOR) 40 MG tablet Take 1 tablet (40 mg total) by mouth daily. 11/02/14  Yes Wendie Agreste, MD  fish oil-omega-3 fatty acids 1000 MG capsule Take 1 g by mouth daily.   Yes Historical Provider, MD  lisinopril (PRINIVIL,ZESTRIL) 10  MG tablet Take 1 tablet (10 mg total) by mouth daily. 11/02/14  Yes Wendie Agreste, MD  LYSINE PO Take 1 tablet by mouth daily.   Yes Historical Provider, MD  MOVIPREP 100 G SOLR (no substitutions)-TAKE AS DIRECTED. Patient not taking: Reported on 01/04/2015 01/04/15   Milus Banister, MD     ROS: The patient denies fevers, chills, night sweats, unintentional weight loss, chest pain, palpitations, wheezing, dyspnea on exertion, nausea, vomiting, abdominal pain, dysuria, hematuria, melena, numbness, weakness, or tingling.   All other systems have been reviewed and were otherwise negative with the exception of those mentioned in the HPI and as above.    PHYSICAL EXAM: Filed Vitals:   01/04/15 1123  BP: 106/78  Pulse: 60  Temp: 98.6 F (37 C)  Resp: 18   Body mass index is 29.76 kg/(m^2).  General: Alert, no acute distress HEENT:  Normocephalic, atraumatic, oropharynx patent. Eye: Juliette Mangle [facility] Cardiovascular:  Regular rate and rhythm, no rubs murmurs or gallops. No Carotid bruits, radial pulse intact. No pedal edema.  Respiratory: Clear to auscultation bilaterally. No wheezes, rales, or rhonchi. No cyanosis, no use of accessory musculature Abdominal: No organomegaly, abdomen is soft and non-tender, positive bowel sounds. No masses. Musculoskeletal: Gait intact. No edema, tenderness GU: Normal. Skin: No rashes. Neurologic: Facial  musculature symmetric. Psychiatric: Patient acts appropriately throughout our interaction. Lymphatic: No cervical or submandibular lymphadenopathy  LABS:  EKG/XRAY:   Primary read interpreted by Dr. Everlene Farrier at [facility].  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 He is currently on cholesterol medication and lisinopril.  Gross sideeffects, risk and benefits, and alternatives of medications d/w patient. Patient is aware that all medications have potential sideeffects and we are unable to predict every sideeffect or drug-drug interaction that  may occur.  [name] MD 01/04/2015 12:23 PM

## 2015-01-04 NOTE — Progress Notes (Signed)
Patient denies any allergies to eggs or soy. Patient denies any problems with anesthesia/sedation. Patient denies any oxygen use at home and does not take any diet/weight loss medications. EMMI education assisgned to patient on colonoscopy, this was explained and instructions given to patient. 

## 2015-01-18 ENCOUNTER — Ambulatory Visit (AMBULATORY_SURGERY_CENTER): Payer: Managed Care, Other (non HMO) | Admitting: Gastroenterology

## 2015-01-18 ENCOUNTER — Encounter: Payer: Self-pay | Admitting: Gastroenterology

## 2015-01-18 VITALS — BP 112/80 | HR 63 | Temp 96.7°F | Resp 21 | Ht 69.0 in | Wt 195.0 lb

## 2015-01-18 DIAGNOSIS — D125 Benign neoplasm of sigmoid colon: Secondary | ICD-10-CM | POA: Diagnosis not present

## 2015-01-18 DIAGNOSIS — Z1211 Encounter for screening for malignant neoplasm of colon: Secondary | ICD-10-CM

## 2015-01-18 MED ORDER — SODIUM CHLORIDE 0.9 % IV SOLN: 500.0000 mL | INTRAVENOUS | Status: DC

## 2015-01-18 NOTE — Progress Notes (Signed)
Stable to RR 

## 2015-01-18 NOTE — Progress Notes (Signed)
Called to room to assist during endoscopic procedure.  Patient ID and intended procedure confirmed with present staff. Received instructions for my participation in the procedure from the performing physician.  

## 2015-01-18 NOTE — Patient Instructions (Signed)
YOU HAD AN ENDOSCOPIC PROCEDURE TODAY AT THE [facility]:   Refer to the procedure report that was given to you for any specific questions about what was found during the examination.  If the procedure report does not answer your questions, please call your gastroenterologist to clarify.  If you requested that your care partner not be given the details of your procedure findings, then the procedure report has been included in a sealed envelope for you to review at your convenience later.  YOU SHOULD EXPECT: Some feelings of bloating in the abdomen. Passage of more gas than usual.  Walking can help get rid of the air that was put into your GI tract during the procedure and reduce the bloating. If you had a lower endoscopy (such as a colonoscopy or flexible sigmoidoscopy) you may notice spotting of blood in your stool or on the toilet paper. If you underwent a bowel prep for your procedure, you may not have a normal bowel movement for a few days.  Please Note:  You might notice some irritation and congestion in your nose or some drainage.  This is from the oxygen used during your procedure.  There is no need for concern and it should clear up in a day or so.  SYMPTOMS TO REPORT IMMEDIATELY:   Following lower endoscopy (colonoscopy or flexible sigmoidoscopy):  Excessive amounts of blood in the stool  Significant tenderness or worsening of abdominal pains  Swelling of the abdomen that is new, acute  Fever of 100F or higher    For urgent or emergent issues, a gastroenterologist can be reached at any hour by calling [phone].   DIET: Your first meal following the procedure should be a small meal and then it is ok to progress to your normal diet. Heavy or fried foods are harder to digest and may make you feel nauseous or bloated.  Likewise, meals heavy in dairy and vegetables can increase bloating.  Drink plenty of fluids but you should avoid alcoholic beverages for 24  hours.  ACTIVITY:  You should plan to take it easy for the rest of today and you should NOT DRIVE or use heavy machinery until tomorrow (because of the sedation medicines used during the test).    FOLLOW UP: Our staff will call the number listed on your records the next business day following your procedure to check on you and address any questions or concerns that you may have regarding the information given to you following your procedure. If we do not reach you, we will leave a message.  However, if you are feeling well and you are not experiencing any problems, there is no need to return our call.  We will assume that you have returned to your regular daily activities without incident.  If any biopsies were taken you will be contacted by phone or by letter within the next 1-3 weeks.  Please call us at [phone] if you have not heard about the biopsies in 3 weeks.    SIGNATURES/CONFIDENTIALITY: You and/or your care partner have signed paperwork which will be entered into your electronic medical record.  These signatures attest to the fact that that the information above on your After Visit Summary has been reviewed and is understood.  Full responsibility of the confidentiality of this discharge information lies with you and/or your care-partner.   Information on polyps given to you today 

## 2015-01-18 NOTE — Op Note (Signed)
[place]  [address] [place], [address]   COLONOSCOPY PROCEDURE REPORT  PATIENT: Bryce, Bush  MR#: [id number] BIRTHDATE: 05-07-1965 , 49  yrs. old GENDER: male ENDOSCOPIST: Milus Banister, MD REFERRED BL:TJQZESP Greene, M.D. PROCEDURE DATE:  01/18/2015 PROCEDURE:   Colonoscopy, screening and Colonoscopy with snare polypectomy First Screening Colonoscopy - Avg.  risk and is 50 yrs.  old or older Yes.  Prior Negative Screening - Now for repeat screening. N/A  History of Adenoma - Now for follow-up colonoscopy & has been > or = to 3 yrs.  N/A  Polyps removed today? Yes ASA CLASS:   Class II INDICATIONS:Screening for colonic neoplasia and Colorectal Neoplasm Risk Assessment for this procedure is average risk. MEDICATIONS: Monitored anesthesia care, Propofol 200 mg IV, and lidocaine 40mg  IV  DESCRIPTION OF PROCEDURE:   After the risks benefits and alternatives of the procedure were thoroughly explained, informed consent was obtained.  The digital rectal exam revealed no abnormalities of the rectum.   The LB [id number]  endoscope was introduced through the anus and advanced to the cecum, which was identified by both the appendix and ileocecal valve. No adverse events experienced.   The quality of the prep was excellent.  The instrument was then slowly withdrawn as the colon was fully examined. Estimated blood loss is zero unless otherwise noted in this procedure report.   COLON FINDINGS: A sessile polyp measuring 4 mm in size was found in the sigmoid colon.  A polypectomy was performed with a cold snare. The resection was complete, the polyp tissue was completely retrieved and sent to histology.   The examination was otherwise normal.  Retroflexed views revealed no abnormalities. The time to cecum = 1.5 Withdrawal time = 6.1   The scope was withdrawn and the procedure completed. COMPLICATIONS: There were no immediate complications.  ENDOSCOPIC  IMPRESSION: 1.   Sessile polyp was found in the sigmoid colon; polypectomy was performed with a cold snare 2.   The examination was otherwise normal  RECOMMENDATIONS: If the polyp(s) removed today are proven to be adenomatous (pre-cancerous) polyps, you will need a repeat colonoscopy in 5 years.  Otherwise you should continue to follow colorectal cancer screening guidelines for "routine risk" patients with colonoscopy in 10 years.  You will receive a letter within 1-2 weeks with the results of your biopsy as well as final recommendations.  Please call my office if you have not received a letter after 3 weeks.  eSigned:  Milus Banister, MD 01/18/2015 9:09 AM

## 2015-01-19 ENCOUNTER — Telehealth: Payer: Self-pay

## 2015-01-19 NOTE — Telephone Encounter (Signed)
  Follow up Call-  Call back number 01/18/2015  Post procedure Call Back phone  # [phone]  Permission to leave phone message Yes     Patient questions:  Do you have a fever, pain , or abdominal swelling? No. Pain Score  0 *  Have you tolerated food without any problems? Yes.    Have you been able to return to your normal activities? Yes.    Do you have any questions about your discharge instructions: Diet   No. Medications  No. Follow up visit  No.  Do you have questions or concerns about your Care? No.  Actions: * If pain score is 4 or above: No action needed, pain <4.  No problems noted per the pt. maw

## 2015-01-26 ENCOUNTER — Encounter: Payer: Self-pay | Admitting: Gastroenterology

## 2015-02-25 ENCOUNTER — Emergency Department (HOSPITAL_COMMUNITY): Admission: EM | Admit: 2015-02-25 | Payer: Managed Care, Other (non HMO) | Source: Home / Self Care

## 2015-04-12 ENCOUNTER — Encounter: Payer: Self-pay | Admitting: Family Medicine

## 2015-04-12 ENCOUNTER — Ambulatory Visit (INDEPENDENT_AMBULATORY_CARE_PROVIDER_SITE_OTHER): Payer: Managed Care, Other (non HMO) | Admitting: Family Medicine

## 2015-04-12 VITALS — BP 100/66 | HR 65 | Temp 99.0°F | Resp 16 | Ht 67.5 in | Wt 191.4 lb

## 2015-04-12 DIAGNOSIS — H7391 Unspecified disorder of tympanic membrane, right ear: Secondary | ICD-10-CM | POA: Diagnosis not present

## 2015-04-12 DIAGNOSIS — E785 Hyperlipidemia, unspecified: Secondary | ICD-10-CM

## 2015-04-12 DIAGNOSIS — I10 Essential (primary) hypertension: Secondary | ICD-10-CM | POA: Diagnosis not present

## 2015-04-12 DIAGNOSIS — H9191 Unspecified hearing loss, right ear: Secondary | ICD-10-CM | POA: Diagnosis not present

## 2015-04-12 LAB — LIPID PANEL
CHOLESTEROL: 152 mg/dL (ref 125–200)
HDL: 59 mg/dL (ref >=40)
LDL CALC: 77 mg/dL (ref <130)
TRIGLYCERIDES: 81 mg/dL (ref <150)
Total CHOL/HDL Ratio: 2.6 Ratio (ref <=5.0)
VLDL: 16 mg/dL (ref <30)

## 2015-04-12 LAB — BASIC METABOLIC PANEL
BUN: 17 mg/dL (ref 7–25)
CO2: 30 mmol/L (ref 20–31)
CREATININE: 0.85 mg/dL (ref 0.70–1.33)
Calcium: 9.5 mg/dL (ref 8.6–10.3)
Chloride: 105 mmol/L (ref 98–110)
Glucose, Bld: 89 mg/dL (ref 65–99)
POTASSIUM: 4.9 mmol/L (ref 3.5–5.3)
Sodium: 140 mmol/L (ref 135–146)
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
## 2015-04-12 NOTE — Patient Instructions (Signed)
We will call you with an appointment with the ear specialist. If you have not heard from our office in two days, please call [phone] and chose the referral number.

## 2015-04-14 ENCOUNTER — Other Ambulatory Visit: Payer: Self-pay | Admitting: Otolaryngology

## 2015-04-14 DIAGNOSIS — H9121 Sudden idiopathic hearing loss, right ear: Secondary | ICD-10-CM

## 2015-04-14 DIAGNOSIS — H9311 Tinnitus, right ear: Secondary | ICD-10-CM

## 2015-04-18 ENCOUNTER — Ambulatory Visit
Admission: RE | Admit: 2015-04-18 | Discharge: 2015-04-18 | Disposition: A | Discharge status: Home / Self Care | Payer: Managed Care, Other (non HMO) | Source: Ambulatory Visit | Attending: Otolaryngology | Admitting: Otolaryngology

## 2015-04-18 DIAGNOSIS — H9311 Tinnitus, right ear: Secondary | ICD-10-CM

## 2015-04-18 DIAGNOSIS — H9121 Sudden idiopathic hearing loss, right ear: Secondary | ICD-10-CM

## 2015-04-18 MED ORDER — GADOBENATE DIMEGLUMINE 529 MG/ML IV SOLN
18.0000 mL | Freq: Once | INTRAVENOUS | Status: AC | PRN
  Administered 2015-04-18: 18 mL via INTRAVENOUS

## 2015-04-20 ENCOUNTER — Other Ambulatory Visit: Payer: Managed Care, Other (non HMO)

## 2015-09-05 ENCOUNTER — Ambulatory Visit (INDEPENDENT_AMBULATORY_CARE_PROVIDER_SITE_OTHER): Payer: Managed Care, Other (non HMO) | Admitting: Urgent Care

## 2015-09-05 VITALS — BP 126/78 | HR 76 | Temp 98.6°F | Resp 16 | Ht 67.0 in | Wt 206.0 lb

## 2015-09-05 DIAGNOSIS — S30860A Insect bite (nonvenomous) of lower back and pelvis, initial encounter: Secondary | ICD-10-CM | POA: Diagnosis not present

## 2015-09-05 DIAGNOSIS — W57XXXA Bitten or stung by nonvenomous insect and other nonvenomous arthropods, initial encounter: Secondary | ICD-10-CM

## 2015-09-05 DIAGNOSIS — L299 Pruritus, unspecified: Secondary | ICD-10-CM | POA: Diagnosis not present

## 2015-09-05 LAB — POCT CBC
GRANULOCYTE PERCENT: 63 % (ref 37–80)
HEMATOCRIT: 44.6 % (ref 43.5–53.7)
Hemoglobin: 15.8 g/dL (ref 14.1–18.1)
Lymph, poc: 2.5 (ref 0.6–3.4)
MCH: 32 pg — AB (ref 27–31.2)
MCHC: 35.4 g/dL (ref 31.8–35.4)
MCV: 90.3 fL (ref 80–97)
MID (CBC): 0.6 (ref 0–0.9)
MPV: 5.7 fL (ref 0–99.8)
POC GRANULOCYTE: 5.3 (ref 2–6.9)
POC LYMPH %: 29.7 % (ref 10–50)
POC MID %: 7.3 %M (ref 0–12)
Platelet Count, POC: 267 10*3/uL (ref 142–424)
RBC: 4.94 M/uL (ref 4.69–6.13)
RDW, POC: 13.8 %
WBC: 8.4 10*3/uL (ref 4.6–10.2)

## 2015-09-05 MED ORDER — CEPHALEXIN 500 MG PO CAPS: 500.0000 mg | ORAL_CAPSULE | Freq: Three times a day (TID) | ORAL | Status: DC

## 2015-09-05 MED ORDER — CETIRIZINE HCL 10 MG PO TABS: 10.0000 mg | ORAL_TABLET | Freq: Every day | ORAL | Status: DC

## 2015-09-05 NOTE — Patient Instructions (Addendum)
Tick Bite Information Ticks are insects that attach themselves to the skin and draw blood for food. There are various types of ticks. Common types include wood ticks and deer ticks. Most ticks live in shrubs and grassy areas. Ticks can climb onto your body when you make contact with leaves or grass where the tick is waiting. The most common places on the body for ticks to attach themselves are the scalp, neck, armpits, waist, and groin. Most tick bites are harmless, but sometimes ticks carry germs that cause diseases. These germs can be spread to a person during the tick's feeding process. The chance of a disease spreading through a tick bite depends on:   The type of tick.  Time of year.   How long the tick is attached.   Geographic location.  HOW CAN YOU PREVENT TICK BITES? Take these steps to help prevent tick bites when you are outdoors:  Wear protective clothing. Long sleeves and long pants are best.   Wear white clothes so you can see ticks more easily.  Tuck your pant legs into your socks.   If walking on a trail, stay in the middle of the trail to avoid brushing against bushes.  Avoid walking through areas with long grass.  Put insect repellent on all exposed skin and along boot tops, pant legs, and sleeve cuffs.   Check clothing, hair, and skin repeatedly and before going inside.   Brush off any ticks that are not attached.  Take a shower or bath as soon as possible after being outdoors.  WHAT IS THE PROPER WAY TO REMOVE A TICK? Ticks should be removed as soon as possible to help prevent diseases caused by tick bites. 1. If latex gloves are available, put them on before trying to remove a tick.  2. Using fine-point tweezers, grasp the tick as close to the skin as possible. You may also use curved forceps or a tick removal tool. Grasp the tick as close to its head as possible. Avoid grasping the tick on its body. 3. Pull gently with steady upward pressure until  the tick lets go. Do not twist the tick or jerk it suddenly. This may break off the tick's head or mouth parts. 4. Do not squeeze or crush the tick's body. This could force disease-carrying fluids from the tick into your body.  5. After the tick is removed, wash the bite area and your hands with soap and water or other disinfectant such as alcohol. 6. Apply a small amount of antiseptic cream or ointment to the bite site.  7. Wash and disinfect any instruments that were used.  Do not try to remove a tick by applying a hot match, petroleum jelly, or fingernail polish to the tick. These methods do not work and may increase the chances of disease being spread from the tick bite.  WHEN SHOULD YOU SEEK MEDICAL CARE? Contact your health care provider if you are unable to remove a tick from your skin or if a part of the tick breaks off and is stuck in the skin.  After a tick bite, you need to be aware of signs and symptoms that could be related to diseases spread by ticks. Contact your health care provider if you develop any of the following in the days or weeks after the tick bite:  Unexplained fever.  Rash. A circular rash that appears days or weeks after the tick bite may indicate the possibility of Lyme disease. The rash may resemble   a target with a bull's-eye and may occur at a different part of your body than the tick bite.  Redness and swelling in the area of the tick bite.   Tender, swollen lymph glands.   Diarrhea.   Weight loss.   Cough.   Fatigue.   Muscle, joint, or bone pain.   Abdominal pain.   Headache.   Lethargy or a change in your level of consciousness.  Difficulty walking or moving your legs.   Numbness in the legs.   Paralysis.  Shortness of breath.   Confusion.   Repeated vomiting.    This information is not intended to replace advice given to you by your health care provider. Make sure you discuss any questions you have with your health  care provider.   Document Released: 03/08/2000 Document Revised: 04/01/2014 Document Reviewed: 08/19/2012 Elsevier Interactive Patient Education 2016 [employer].     IF you received an x-ray today, you will receive an invoice from [facility]. Please contact [facility] at [phone] with questions or concerns regarding your invoice.   IF you received labwork today, you will receive an invoice from [employer]. Please contact [facility] at [phone] with questions or concerns regarding your invoice.   Our billing staff will not be able to assist you with questions regarding bills from these companies.  You will be contacted with the lab results as soon as they are available. The fastest way to get your results is to activate your My Chart account. Instructions are located on the last page of this paperwork. If you have not heard from [place] regarding the results in 2 weeks, please contact this office.

## 2015-09-05 NOTE — Progress Notes (Signed)
    MRN: [id number] DOB: 1966-02-02  Subjective:   Bryce Bush is a 50 y.o. male presenting for chief complaint of Insect Bite  Reports >1 week history of tick bite on his back. His son removed the tick last night using a paper towel. Has some itching over the bite wound. Has a history of headaches, this has not changed. Denies fever, red eyes, cough, chest pain, rashes, n/v, abdominal pain. Denies smoking cigarettes.   Bryce Bush has a current medication list which includes the following prescription(s): aspirin, atorvastatin, fish oil-omega-3 fatty acids, lisinopril, and lysine. Also has No Known Allergies.  Bryce Bush  has a past medical history of Hypertension and Hypercholesteremia. Also  has past surgical history that includes Appendectomy; Tonsillectomy; and Spine surgery.  Objective:   Vitals: BP 126/78 mmHg  Pulse 76  Temp(Src) 98.6 F (37 C)  Resp 16  Ht 5\' 7"  (1.702 m)  Wt 206 lb (93.441 kg)  BMI 32.26 kg/m2  SpO2 96%  Physical Exam  Constitutional: He is oriented to person, place, and time. He appears well-developed and well-nourished.  HENT:  Mouth/Throat: Oropharynx is clear and moist.  Eyes: Pupils are equal, round, and reactive to light. Right eye exhibits no discharge. Left eye exhibits no discharge.  Cardiovascular: Normal rate, regular rhythm and intact distal pulses.  Exam reveals no gallop and no friction rub.   No murmur heard. Pulmonary/Chest: No respiratory distress. He has no wheezes. He has no rales.  Neurological: He is alert and oriented to person, place, and time.  Skin: Skin is warm and dry. Rash (pruritic, non-tender, erythematous and slightly indurated area over back as depicted) noted.     No results found for this or any previous visit (from the past 24 hour(s)).  Assessment and Plan :   1. Tick bite of back, initial encounter 2. Itching - Counseled on tick borne illness. Will start patient on Zyrtec and Keflex to address itching and infection. Labs  pending, add additional interventions as necessary.  [name], PA-C [facility] [phone] 09/05/2015 3:33 PM

## 2015-09-07 LAB — ROCKY MTN SPOTTED FVR ABS PNL(IGG+IGM)
RMSF IGM: NOT DETECTED
RMSF IgG: NOT DETECTED

## 2015-09-09 LAB — EHRLICHIA ANTIBODY PANEL: E chaffeensis (HGE) Ab, IgG: <1:64 {titer}

## 2015-11-04 ENCOUNTER — Other Ambulatory Visit: Payer: Self-pay | Admitting: Family Medicine

## 2015-11-04 DIAGNOSIS — E785 Hyperlipidemia, unspecified: Secondary | ICD-10-CM

## 2015-11-22 ENCOUNTER — Ambulatory Visit (INDEPENDENT_AMBULATORY_CARE_PROVIDER_SITE_OTHER): Payer: Managed Care, Other (non HMO) | Admitting: Family Medicine

## 2015-11-22 ENCOUNTER — Encounter: Payer: Self-pay | Admitting: Family Medicine

## 2015-11-22 VITALS — BP 118/82 | HR 69 | Temp 98.1°F | Resp 18 | Ht 67.0 in | Wt 210.6 lb

## 2015-11-22 DIAGNOSIS — Z23 Encounter for immunization: Secondary | ICD-10-CM

## 2015-11-22 DIAGNOSIS — I10 Essential (primary) hypertension: Secondary | ICD-10-CM | POA: Diagnosis not present

## 2015-11-22 DIAGNOSIS — Z131 Encounter for screening for diabetes mellitus: Secondary | ICD-10-CM

## 2015-11-22 DIAGNOSIS — Z0000 Encounter for general adult medical examination without abnormal findings: Secondary | ICD-10-CM

## 2015-11-22 DIAGNOSIS — E785 Hyperlipidemia, unspecified: Secondary | ICD-10-CM | POA: Diagnosis not present

## 2015-11-22 DIAGNOSIS — Z114 Encounter for screening for human immunodeficiency virus [HIV]: Secondary | ICD-10-CM | POA: Diagnosis not present

## 2015-11-22 DIAGNOSIS — Z125 Encounter for screening for malignant neoplasm of prostate: Secondary | ICD-10-CM

## 2015-11-22 LAB — CBC WITH DIFFERENTIAL/PLATELET
BASOS ABS: 63 {cells}/uL (ref 0–200)
Basophils Relative: 1 %
EOS PCT: 2 %
Eosinophils Absolute: 126 cells/uL (ref 15–500)
HCT: 44.6 % (ref 38.5–50.0)
Hemoglobin: 15.6 g/dL (ref 13.2–17.1)
Lymphocytes Relative: 26 %
Lymphs Abs: 1638 cells/uL (ref 850–3900)
MCH: 31.5 pg (ref 27.0–33.0)
MCHC: 35 g/dL (ref 32.0–36.0)
MCV: 90.1 fL (ref 80.0–100.0)
MONOS PCT: 8 %
MPV: 8.8 fL (ref 7.5–12.5)
Monocytes Absolute: 504 cells/uL (ref 200–950)
NEUTROS PCT: 63 %
Neutro Abs: 3969 cells/uL (ref 1500–7800)
PLATELETS: 273 10*3/uL (ref 140–400)
RBC: 4.95 MIL/uL (ref 4.20–5.80)
RDW: 13.8 % (ref 11.0–15.0)
WBC: 6.3 10*3/uL (ref 3.8–10.8)

## 2015-11-22 LAB — LIPID PANEL
CHOL/HDL RATIO: 3.8 ratio (ref <=5.0)
CHOLESTEROL: 168 mg/dL (ref 125–200)
HDL: 44 mg/dL (ref >=40)
LDL Cholesterol: 53 mg/dL (ref <130)
Triglycerides: 354 mg/dL — ABNORMAL HIGH (ref <150)
VLDL: 71 mg/dL — AB (ref <30)

## 2015-11-22 LAB — POCT URINALYSIS DIP (MANUAL ENTRY)
BILIRUBIN UA: NEGATIVE
GLUCOSE UA: NEGATIVE
Ketones, POC UA: NEGATIVE
Leukocytes, UA: NEGATIVE
NITRITE UA: NEGATIVE
Protein Ur, POC: NEGATIVE
SPEC GRAV UA: 1.02
UROBILINOGEN UA: 0.2
pH, UA: 5.5

## 2015-11-22 LAB — COMPREHENSIVE METABOLIC PANEL
ALT: 23 U/L (ref 9–46)
AST: 16 U/L (ref 10–35)
Albumin: 4.2 g/dL (ref 3.6–5.1)
Alkaline Phosphatase: 58 U/L (ref 40–115)
BUN: 15 mg/dL (ref 7–25)
CHLORIDE: 103 mmol/L (ref 98–110)
CO2: 24 mmol/L (ref 20–31)
CREATININE: 0.87 mg/dL (ref 0.70–1.33)
Calcium: 8.9 mg/dL (ref 8.6–10.3)
GLUCOSE: 91 mg/dL (ref 65–99)
Potassium: 4.3 mmol/L (ref 3.5–5.3)
SODIUM: 139 mmol/L (ref 135–146)
Total Bilirubin: 0.5 mg/dL (ref 0.2–1.2)
Total Protein: 6.7 g/dL (ref 6.1–8.1)

## 2015-11-22 LAB — POC MICROSCOPIC URINALYSIS (UMFC): MUCUS RE: ABSENT

## 2015-11-22 LAB — HEMOGLOBIN A1C
Hgb A1c MFr Bld: 5.3 % (ref <5.7)
Mean Plasma Glucose: 105 mg/dL

## 2015-11-22 LAB — HIV ANTIBODY (ROUTINE TESTING W REFLEX): HIV 1&2 Ab, 4th Generation: NONREACTIVE

## 2015-11-22 LAB — TSH: TSH: 2.25 m[IU]/L (ref 0.40–4.50)

## 2015-11-22 MED ORDER — ATORVASTATIN CALCIUM 40 MG PO TABS: 40.0000 mg | ORAL_TABLET | Freq: Every day | ORAL | 3 refills | Status: DC

## 2015-11-22 MED ORDER — LISINOPRIL 10 MG PO TABS: 10.0000 mg | ORAL_TABLET | Freq: Every day | ORAL | 3 refills | Status: DC

## 2015-11-22 NOTE — Patient Instructions (Addendum)
IF you received an x-ray today, you will receive an invoice from [facility]. Please contact [facility] at [phone] with questions or concerns regarding your invoice.   IF you received labwork today, you will receive an invoice from [employer]. Please contact [facility] at [phone] with questions or concerns regarding your invoice.   Our billing staff will not be able to assist you with questions regarding bills from these companies.  You will be contacted with the lab results as soon as they are available. The fastest way to get your results is to activate your My Chart account. Instructions are located on the last page of this paperwork. If you have not heard from [place] regarding the results in 2 weeks, please contact this office.     cpeKeeping you healthy  Get these tests  Blood pressure- Have your blood pressure checked once a year by your healthcare provider.  Normal blood pressure is 120/80  Weight- Have your body mass index (BMI) calculated to screen for obesity.  BMI is a measure of body fat based on height and weight. You can also calculate your own BMI at ViewBanking.[email or web address].  Cholesterol- Have your cholesterol checked every year.  Diabetes- Have your blood sugar checked regularly if you have high blood pressure, high cholesterol, have a family history of diabetes or if you are overweight.  Screening for Colon Cancer- Colonoscopy starting at age 76.  Screening may begin sooner depending on your family history and other health conditions. Follow up colonoscopy as directed by your Gastroenterologist.  Screening for Prostate Cancer- Both blood work (PSA) and a rectal exam help screen for Prostate Cancer.  Screening begins at age 73 with African-American men and at age 63 with Caucasian men.  Screening may begin sooner depending on your family history.  Take these medicines  Aspirin- One aspirin daily can help prevent  Heart disease and Stroke.  Flu shot- Every fall.  Tetanus- Every 10 years.  Zostavax- Once after the age of 89 to prevent Shingles.  Pneumonia shot- Once after the age of 68; if you are younger than 38, ask your healthcare provider if you need a Pneumonia shot.  Take these steps  Don't smoke- If you do smoke, talk to your doctor about quitting.  For tips on how to quit, go to www.smokefree.gov or call 1-800-QUIT-NOW.  Be physically active- Exercise 5 days a week for at least 30 minutes.  If you are not already physically active start slow and gradually work up to 30 minutes of moderate physical activity.  Examples of moderate activity include walking briskly, mowing the yard, dancing, swimming, bicycling, etc.  Eat a healthy diet- Eat a variety of healthy food such as fruits, vegetables, low fat milk, low fat cheese, yogurt, lean meant, poultry, fish, beans, tofu, etc. For more information go to www.thenutritionsource.org  Drink alcohol in moderation- Limit alcohol intake to less than two drinks a day. Never drink and drive.  Dentist- Brush and floss twice daily; visit your dentist twice a year.  Depression- Your emotional health is as important as your physical health. If you're feeling down, or losing interest in things you would normally enjoy please talk to your healthcare provider.  Eye exam- Visit your eye doctor every year.  Safe sex- If you may be exposed to a sexually transmitted infection, use a condom.  Seat belts- Seat belts can save your life; always wear one.  Smoke/Carbon Monoxide detectors- These detectors need to be installed  on the appropriate level of your home.  Replace batteries at least once a year.  Skin cancer- When out in the sun, cover up and use sunscreen 15 SPF or higher.  Violence- If anyone is threatening you, please tell your healthcare provider. Living Will/ Health care power of attorney- Speak with your healthcare provider and family.
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
## 2015-11-23 LAB — PSA: PSA: 0.5 ng/mL (ref <=4.0)

## 2016-01-01 ENCOUNTER — Ambulatory Visit (INDEPENDENT_AMBULATORY_CARE_PROVIDER_SITE_OTHER): Payer: Self-pay | Admitting: Urgent Care

## 2016-01-01 VITALS — BP 136/88 | HR 63 | Temp 98.0°F | Resp 18 | Ht 68.25 in | Wt 218.0 lb

## 2016-01-01 DIAGNOSIS — I10 Essential (primary) hypertension: Secondary | ICD-10-CM

## 2016-01-01 DIAGNOSIS — Z024 Encounter for examination for driving license: Secondary | ICD-10-CM

## 2016-01-01 DIAGNOSIS — E785 Hyperlipidemia, unspecified: Secondary | ICD-10-CM

## 2016-01-01 NOTE — Patient Instructions (Addendum)
Keeping you healthy  Get these tests  Blood pressure- Have your blood pressure checked once a year by your healthcare provider.  Normal blood pressure is 120/80  Weight- Have your body mass index (BMI) calculated to screen for obesity.  BMI is a measure of body fat based on height and weight. You can also calculate your own BMI at [email or web address]/[email or web address]/.  Cholesterol- Have your cholesterol checked every year.  Diabetes- Have your blood sugar checked regularly if you have high blood pressure, high cholesterol, have a family history of diabetes or if you are overweight.  Screening for Colon Cancer- Colonoscopy starting at age 50.  Screening may begin sooner depending on your family history and other health conditions. Follow up colonoscopy as directed by your Gastroenterologist.  Screening for Prostate Cancer- Both blood work (PSA) and a rectal exam help screen for Prostate Cancer.  Screening begins at age 40 with African-American men and at age 50 with Caucasian men.  Screening may begin sooner depending on your family history.  Take these medicines  Aspirin- One aspirin daily can help prevent Heart disease and Stroke.  Flu shot- Every fall.  Tetanus- Every 10 years.  Zostavax- Once after the age of 60 to prevent Shingles.  Pneumonia shot- Once after the age of 65; if you are younger than 65, ask your healthcare provider if you need a Pneumonia shot.  Take these steps  Don't smoke- If you do smoke, talk to your doctor about quitting.  For tips on how to quit, go to www.smokefree.gov or call 1-800-QUIT-NOW.  Be physically active- Exercise 5 days a week for at least 30 minutes.  If you are not already physically active start slow and gradually work up to 30 minutes of moderate physical activity.  Examples of moderate activity include walking briskly, mowing the yard, dancing, swimming, bicycling, etc.  Eat a healthy diet- Eat a variety of healthy food such as fruits, vegetables, low  fat milk, low fat cheese, yogurt, lean meant, poultry, fish, beans, tofu, etc. For more information go to www.thenutritionsource.org  Drink alcohol in moderation- Limit alcohol intake to less than two drinks a day. Never drink and drive.  Dentist- Brush and floss twice daily; visit your dentist twice a year.  Depression- Your emotional health is as important as your physical health. If you're feeling down, or losing interest in things you would normally enjoy please talk to your healthcare provider.  Eye exam- Visit your eye doctor every year.  Safe sex- If you may be exposed to a sexually transmitted infection, use a condom.  Seat belts- Seat belts can save your life; always wear one.  Smoke/Carbon Monoxide detectors- These detectors need to be installed on the appropriate level of your home.  Replace batteries at least once a year.  Skin cancer- When out in the sun, cover up and use sunscreen 15 SPF or higher.  Violence- If anyone is threatening you, please tell your healthcare provider.  Living Will/ Health care power of attorney- Speak with your healthcare provider and family.    IF you received an x-ray today, you will receive an invoice from [facility]. Please contact [facility] at [phone] with questions or concerns regarding your invoice.   IF you received labwork today, you will receive an invoice from [facility]. Please contact [facility] at [phone] with questions or concerns regarding your invoice.   Our billing staff will not be able to assist you with questions regarding bills from these companies.    You will be contacted with the lab results as soon as they are available. The fastest way to get your results is to activate your My Chart account. Instructions are located on the last page of this paperwork. If you have not heard from us regarding the results in 2 weeks, please contact this office.      
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
## 2016-08-09 IMAGING — MR MR HEAD WO/W CM
10 of 11 series · 31 of 48 positions shown · IV contrast (18ml Multihance)
Comparison: Cervical spine MRI 03/09/2012. CT head and cervical
spine 01/08/2012.

CLINICAL DATA: 49-year-old male with 2 week history of sudden onset
right side complete hearing loss. No associated dizziness or
imbalance. Right side tinnitus. Initial encounter.

EXAM:
MRI HEAD WITHOUT AND WITH CONTRAST
TECHNIQUE: Multiplanar, multiecho pulse sequences of the brain and surrounding
structures were obtained without and with intravenous contrast.
CONTRAST:  18mL MULTIHANCE GADOBENATE DIMEGLUMINE 529 MG/ML IV SOLN

[Series 3: T1 · sagittal · 5.0mm · 0.45mm/px · 4 of 23 slices shown (1 of 4)]
[im 1/23]
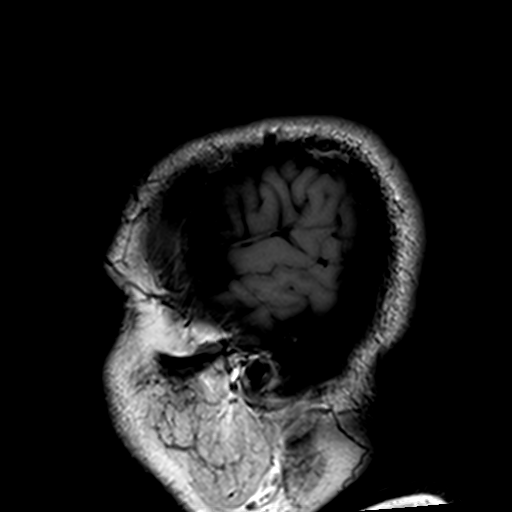
[im 8/23]
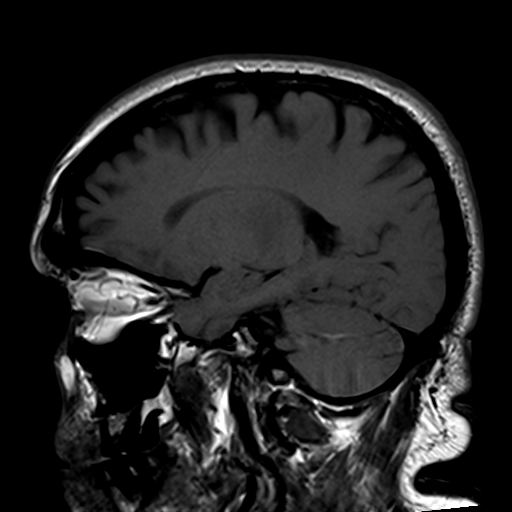
[im 15/23]
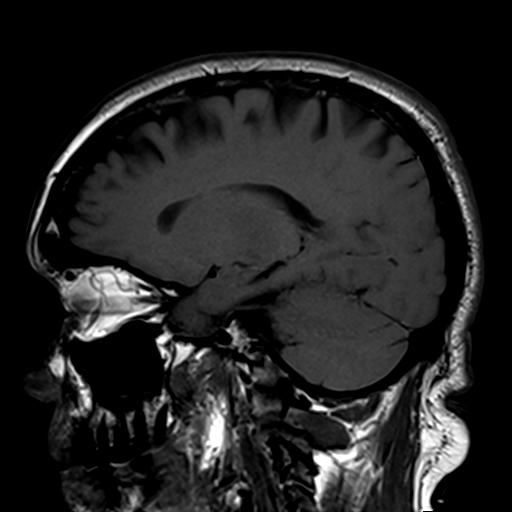
[im 23/23]
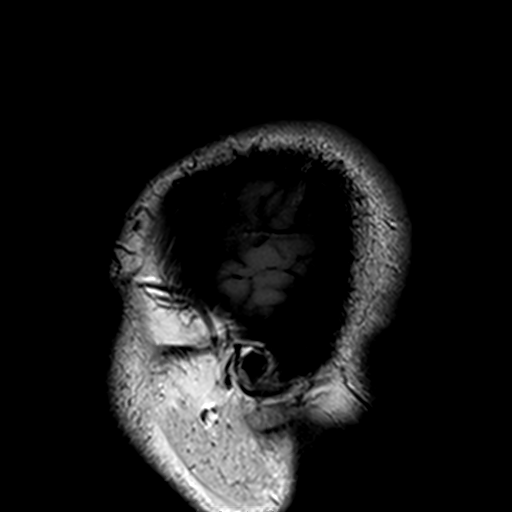

[Series 4: ep2d_diff_(id)_trace · axial · 3.0mm · 1.80mm/px · z∈[-40,+106]mm · 8 of 100 slices shown]
[im 1/100]
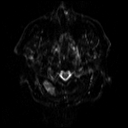
[im 17/100]
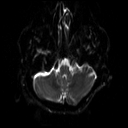
[im 34/100]
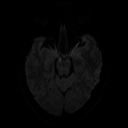
[im 42/100]
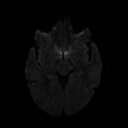
[im 58/100]
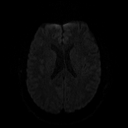
[im 67/100]
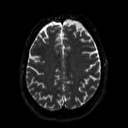
[im 83/100]
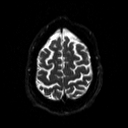
[im 100/100]
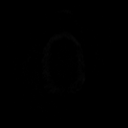

[Series 5: ep2d_diff_(id)_trace_adc · axial · 3.0mm · 1.80mm/px · z∈[-40,+106]mm · 6 of 50 slices shown]
[im 1/50]
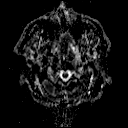
[im 10/50]
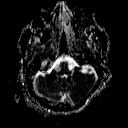
[im 20/50]
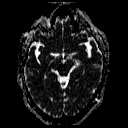
[im 30/50]
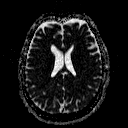
[im 40/50]
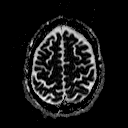
[im 50/50]
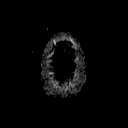

[Series 6: T2 · axial · 5.0mm · 0.45mm/px · z∈[-34,+108]mm · 3 of 23 slices shown]
[im 1/23]
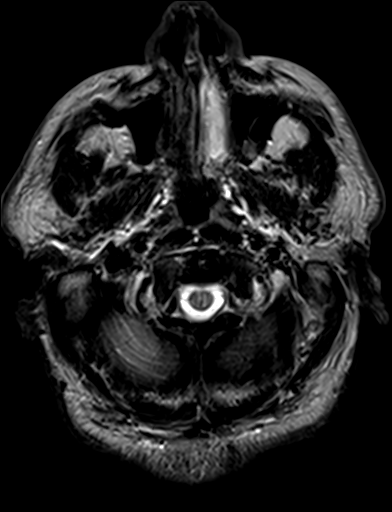
[im 12/23]
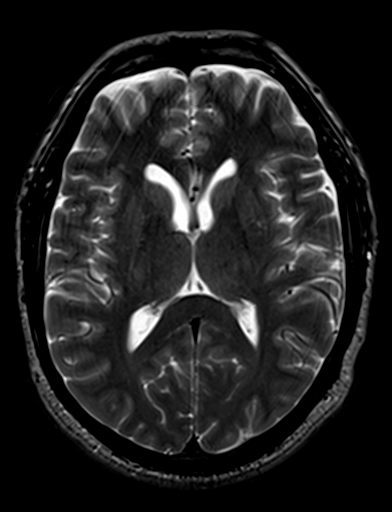
[im 23/23]
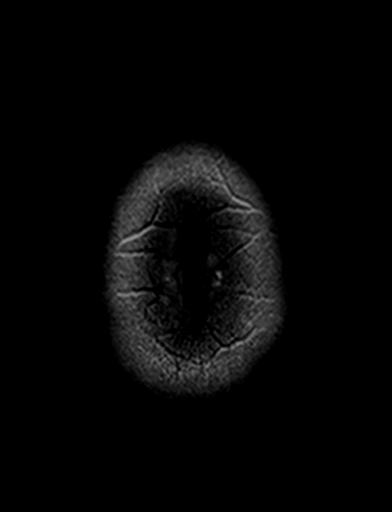

[Series 7: FLAIR · axial · 5.0mm · 0.45mm/px · z∈[-35,+107]mm · 3 of 23 slices shown]
[im 1/23]
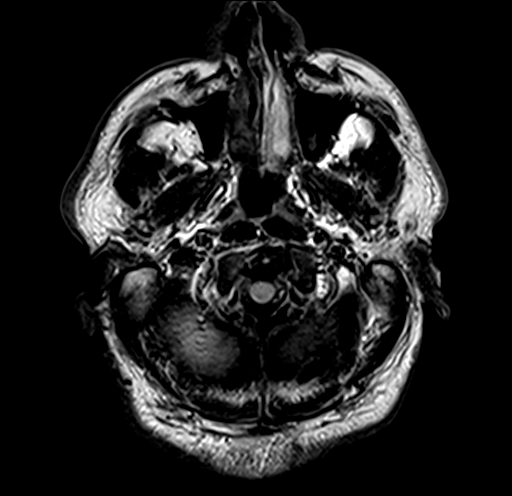
[im 12/23]
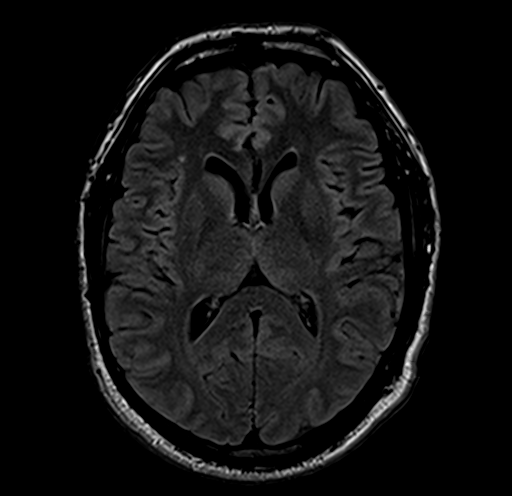
[im 23/23]
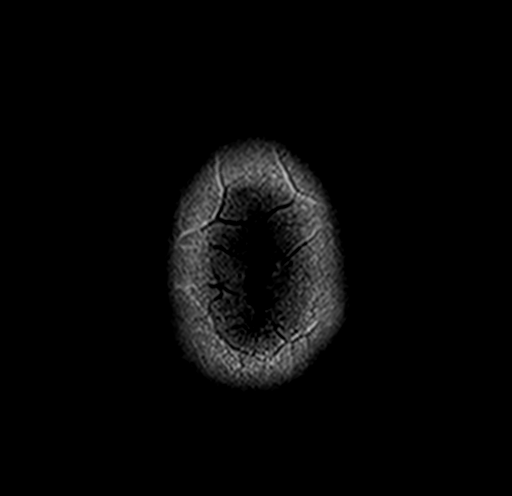

[Series 8: T1 · coronal · 3.0mm · 0.35mm/px · 1 of 11 slices shown (2 of 4)]
[im 1/11]
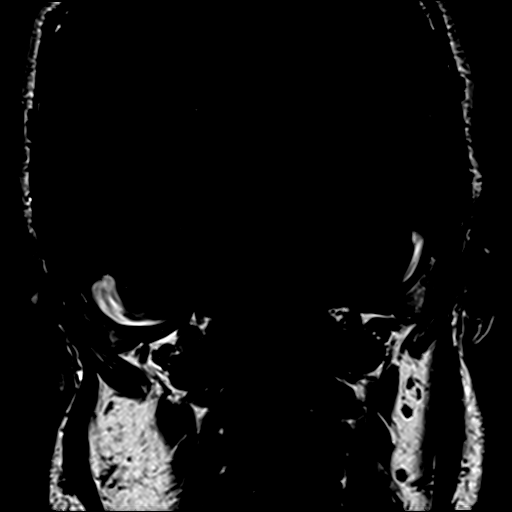

[Series 9: T1 · axial · 3.0mm · 0.35mm/px · 1 of 11 slices shown (3 of 4)]
[im 1/11]
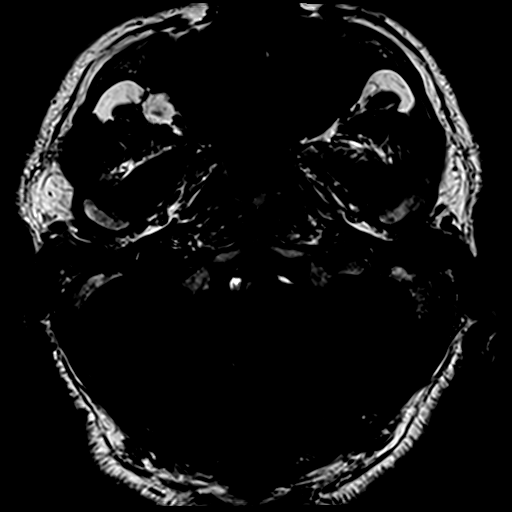

[Series 10: bSSFP · axial · 0.7mm · 0.28mm/px · z∈[-28,-15]mm · 3 of 40 slices shown]
[im 1/40]
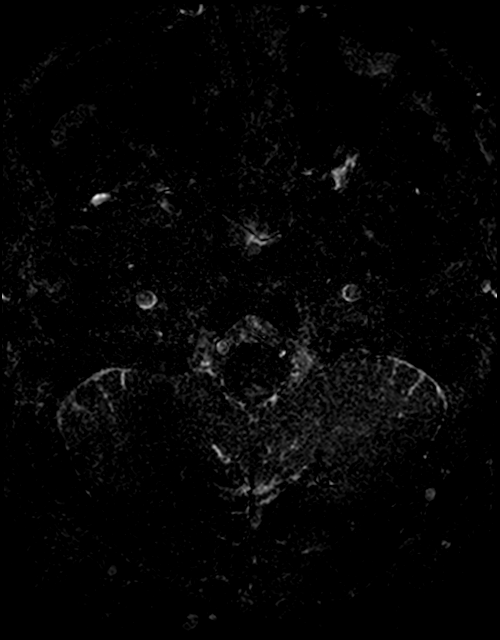
[im 10/40]
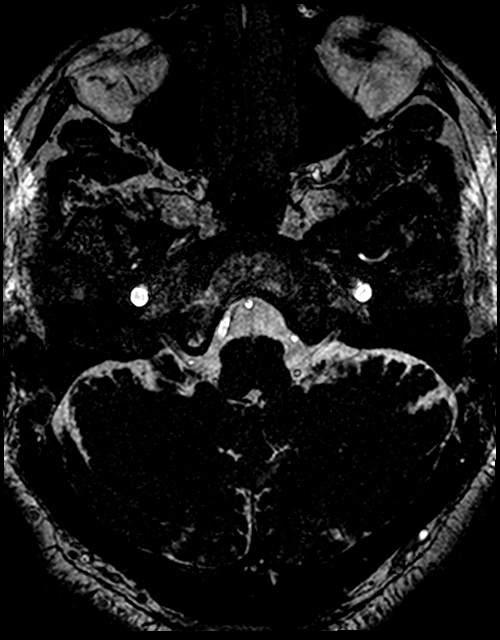
[im 20/40]
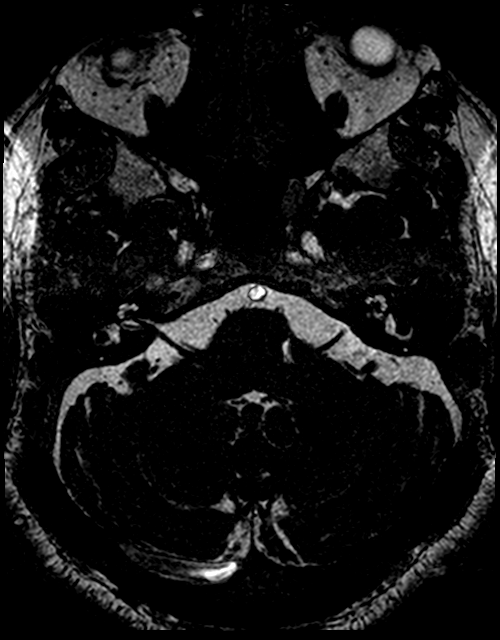

[Series 11: T1 · coronal · 3.0mm · 0.35mm/px · 1 of 11 slices shown (4 of 4)]
[im 1/11]
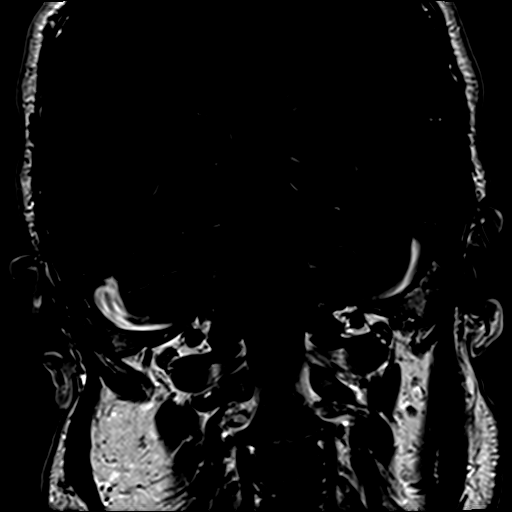

[Series 12: T1 post-contrast · axial · 3.0mm · 0.35mm/px · 1 of 11 slices shown]
[im 1/11]
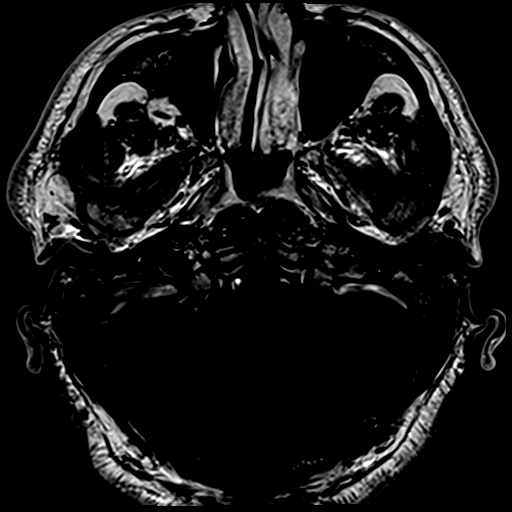

[31 of 48 positions shown; findings below may reference images not displayed]

FINDINGS: Cerebral volume is stable and within normal limits. There are brain
findings stable visualized cervical spine. Major intracranial
vascular flow voids are within normal limits.

Upper limits of normal for age scattered nonspecific small cerebral
white matter T2 and FLAIR hyperintense foci. These are mostly
subcortical. No cortical encephalomalacia. Otherwise normal gray and
white matter signal. No abnormal enhancement identified.

Orbit and scalp soft tissues are within normal limits. Mild chronic
deformity to the posterior wall of the right maxillary sinus is
stable. Trace ethmoid sinus mucosal thickening. Hyperostosis of the
occipital bone incidentally noted. Visualized bone marrow signal is
within normal limits.

Dedicated internal auditory canal imaging. Normal cerebellopontine
angles. There is some tortuosity of the left PICA resulting in mild
mass effect at the left pontomedullary junction (series 10, image
19), but this does not appear to directly affects the left
seventh/eighth cranial nerve root entry zone (image 21). Normal
bilateral cisternal and intracanalicular 7th and 8th cranial nerves
segments. No abnormal enhancement identified. Mastoids are clear.
Normal stylomastoid foramina. Normal visualized parotid glands.
IMPRESSION: 1. Negative IAC imaging with no finding identified to explain right
side hearing loss.
2. Normal for age MRI appearance of the brain.

## 2016-11-14 ENCOUNTER — Other Ambulatory Visit: Payer: Self-pay | Admitting: Family Medicine

## 2016-11-14 DIAGNOSIS — I10 Essential (primary) hypertension: Secondary | ICD-10-CM

## 2016-11-15 NOTE — Telephone Encounter (Signed)
Call --- overdue for follow-up; please schedule CPE with me in upcoming month.  I only approved 30 day supply of Lisinopril.

## 2016-11-27 ENCOUNTER — Ambulatory Visit (INDEPENDENT_AMBULATORY_CARE_PROVIDER_SITE_OTHER): Payer: Managed Care, Other (non HMO) | Admitting: Urgent Care

## 2016-11-27 ENCOUNTER — Encounter: Payer: Self-pay | Admitting: Urgent Care

## 2016-11-27 VITALS — BP 146/91 | HR 60 | Temp 97.9°F | Resp 18 | Ht 68.0 in | Wt 220.4 lb

## 2016-11-27 DIAGNOSIS — I10 Essential (primary) hypertension: Secondary | ICD-10-CM

## 2016-11-27 DIAGNOSIS — Z23 Encounter for immunization: Secondary | ICD-10-CM

## 2016-11-27 DIAGNOSIS — E6609 Other obesity due to excess calories: Secondary | ICD-10-CM

## 2016-11-27 DIAGNOSIS — Z0000 Encounter for general adult medical examination without abnormal findings: Secondary | ICD-10-CM | POA: Diagnosis not present

## 2016-11-27 DIAGNOSIS — E782 Mixed hyperlipidemia: Secondary | ICD-10-CM | POA: Diagnosis not present

## 2016-11-27 DIAGNOSIS — Z6833 Body mass index (BMI) 33.0-33.9, adult: Secondary | ICD-10-CM

## 2016-11-27 DIAGNOSIS — R351 Nocturia: Secondary | ICD-10-CM

## 2016-11-27 MED ORDER — LISINOPRIL 10 MG PO TABS: 10.0000 mg | ORAL_TABLET | Freq: Every day | ORAL | 1 refills | Status: DC

## 2016-11-27 NOTE — Progress Notes (Signed)
MRN: [id number]  Subjective:   [name] is a 51 y.o. male presenting for annual physical exam. Patient is currently single, has had 2 adopted children. Has good relationships at home, has a good support network. Denies smoking cigarettes or drinking alcohol.   Medical care team includes: PCP: [name], PA-C Vision: Wears glasses, has yearly eye exams. Dental: Had a root canal recently, gets regular dental care. Specialists: None.    Reports ~2 years of having nocturia, post-micturition dribble. Denies ROS as below and straining to urinate, weakened urinary flow, pelvic fullness.   Bubber has a current medication list which includes the following prescription(s): aspirin, atorvastatin, fish oil-omega-3 fatty acids, lisinopril, and lysine. He has No Known Allergies. Sadiq  has a past medical history of Hypercholesteremia and Hypertension. Also  has a past surgical history that includes Appendectomy; Tonsillectomy; and Spine surgery. His family history includes Alcohol abuse in his brother; COPD in his brother; Cancer (age of onset: 32) in his father; Berenice Primas' disease in his brother; Heart disease (age of onset: 10) in his father; Hyperlipidemia in his mother; Hypertension in his mother.  Immunizations: Flu vaccine updated today.  Review of Systems  Constitutional: Negative for chills, diaphoresis, fever, malaise/fatigue and weight loss.  HENT: Negative for congestion, ear discharge, ear pain, hearing loss, nosebleeds, sore throat and tinnitus.   Eyes: Negative for blurred vision, double vision, photophobia, pain, discharge and redness.  Respiratory: Negative for cough, shortness of breath and wheezing.   Cardiovascular: Negative for chest pain, palpitations and leg swelling.  Gastrointestinal: Negative for abdominal pain, blood in stool, constipation, diarrhea, nausea and vomiting.  Genitourinary: Negative for dysuria, flank pain, frequency, hematuria and urgency.   Nocturia, post-micturition dribble.  Musculoskeletal: Negative for back pain, joint pain and myalgias.  Skin: Negative for itching and rash.  Neurological: Negative for dizziness, tingling, seizures, loss of consciousness, weakness and headaches.  Endo/Heme/Allergies: Negative for polydipsia.  Psychiatric/Behavioral: Negative for depression, hallucinations, memory loss, substance abuse and suicidal ideas. The patient is not nervous/anxious and does not have insomnia.    Objective:   Vitals: BP (!) 146/91   Pulse 60   Temp 97.9 F (36.6 C) (Oral)   Resp 18   Ht 5\' 8"  (1.727 m)   Wt 220 lb 6.4 oz (100 kg)   SpO2 98%   BMI 33.51 kg/m   BP Readings from Last 3 Encounters:  11/27/16 (!) 146/91  01/01/16 136/88  11/22/15 118/82     Visual Acuity Screening   Right eye Left eye Both eyes  Without correction:     With correction: 20/20 20/20 20/20     Wt Readings from Last 3 Encounters:  11/27/16 220 lb 6.4 oz (100 kg)  01/01/16 218 lb (98.9 kg)  11/22/15 210 lb 9.6 oz (95.5 kg)   Physical Exam  Constitutional: He is oriented to person, place, and time. He appears well-developed and well-nourished.  HENT:  TM's intact bilaterally, no effusions or erythema. Nasal turbinates pink and moist, nasal passages patent. No sinus tenderness. Oropharynx clear, mucous membranes moist, dentition in good repair.  Eyes: Pupils are equal, round, and reactive to light. Conjunctivae and EOM are normal. Right eye exhibits no discharge. Left eye exhibits no discharge. No scleral icterus.  Neck: Normal range of motion. Neck supple. No thyromegaly present.  Cardiovascular: Normal rate, regular rhythm and intact distal pulses.  Exam reveals no gallop and no friction rub.   No murmur heard. Pulmonary/Chest: No stridor. No respiratory distress. He  has no wheezes. He has no rales.  Abdominal: Soft. Bowel sounds are normal. He exhibits no distension and no mass. There is no tenderness.  Musculoskeletal:  Normal range of motion. He exhibits no edema or tenderness.  Lymphadenopathy:    He has no cervical adenopathy.  Neurological: He is alert and oriented to person, place, and time. He has normal reflexes.  Skin: Skin is warm and dry. No rash noted. No erythema. No pallor.  Psychiatric: He has a normal mood and affect.   Assessment and Plan :   1. Annual physical exam - Very pleasant person, medically stable, labs pending. Discussed healthy lifestyle, diet, exercise, preventative care, vaccinations, and addressed patient's concerns.  - Lipid panel - Comprehensive metabolic panel - CBC - TSH  2. Essential hypertension 3. Mixed hyperlipidemia 4. Class 1 obesity due to excess calories without serious comorbidity with body mass index (BMI) of 33.0 to 33.9 in adult - Recommended dietary and lifestyle modifications. Labs pending. Refilled lisinopril, will refill atorvastatin as appropriate, labs pending. Recheck in 4 weeks for HTN.  5. Nocturia - Patient will hold off on his water intake 2 hours before bed time. Labs pending. He does not have classic BPH symptoms and his physical exam is very reassuring. Will continue to monitor and follow up with labs. - PSA - Hemoglobin A1c  6. Need for influenza vaccination - Flu Vaccine QUAD 6+ mos PF IM (Fluarix Quad PF)   [name], PA-C [facility] at [facility] [phone] 11/27/2016  9:29 AM

## 2016-11-27 NOTE — Patient Instructions (Addendum)
For seasoning you can use Mrs. Deliah Boston, this is a salt-free and very friendly for blood pressure patients.  Salads - kale, spinach, cabbage, spring mix; use seeds like pumpkin seeds or sunflower seeds; you can also use 1-2 hard boiled eggs. Fruits - avocadoes, berries (blueberries, raspberries, blackberries), apples, oranges, pomegranate, grapefruit Seeds - quinoa, chia seeds; you can also incorporate oatmeal Vegetables - aspargus, cauliflower, broccoli, green beans, brussel spouts, bell peppers; stay away from starchy vegetables like potatoes, carrots, peas  Brussel sprouts - Cut off stems. Place in a mixing bowl that has a lid. Pour in a 1/4-1/2 cup olive oil, spices, use a light amount of parmesan. Place on a baking sheet. Bake for 10 minutes at 400F. Take it out, eat the brussel chips. Place for another 5-10 minutes.   Mashed cauliflower - Boil a bunch of cauliflower in a pot of water. Blend in a food processor with 1-2 tablespoons of butter.  Spaghetti squash -  Cut the squash in half very carefully, clean out seeds from the middle. Place 1/2 face down in a microwave safe dish with at least 2 inches of water. Make 4-6 slits on outside of spaghetti squash and microwave for 10-12 minutes. Take out the spaghetti using a metal spoon. Repeat for the other half.   Vega protein is good protein powder, make sure you use ~6 ice cubes to give it smoothie consistency together with ~4-6 ounces of vanilla soy milk. Throw cinnamon into your shake, use peanut butter. You can also use the fruits that I listed above. Throw spinach or kale into the shake.     Keeping you healthy  Get these tests  Blood pressure- Have your blood pressure checked once a year by your healthcare provider.  Normal blood pressure is 120/80  Weight- Have your body mass index (BMI) calculated to screen for obesity.  BMI is a measure of body fat based on height and weight. You can also calculate your own BMI at  ViewBanking.si.  Cholesterol- Have your cholesterol checked every year.  Diabetes- Have your blood sugar checked regularly if you have high blood pressure, high cholesterol, have a family history of diabetes or if you are overweight.  Screening for Colon Cancer- Colonoscopy starting at age 50.  Screening may begin sooner depending on your family history and other health conditions. Follow up colonoscopy as directed by your Gastroenterologist.  Screening for Prostate Cancer- Both blood work (PSA) and a rectal exam help screen for Prostate Cancer.  Screening begins at age 4 with African-American men and at age 62 with Caucasian men.  Screening may begin sooner depending on your family history.  Take these medicines  Aspirin- One aspirin daily can help prevent Heart disease and Stroke.  Flu shot- Every fall.  Tetanus- Every 10 years.  Zostavax- Once after the age of 86 to prevent Shingles.  Pneumonia shot- Once after the age of 45; if you are younger than 19, ask your healthcare provider if you need a Pneumonia shot.  Take these steps  Don't smoke- If you do smoke, talk to your doctor about quitting.  For tips on how to quit, go to www.smokefree.gov or call 1-800-QUIT-NOW.  Be physically active- Exercise 5 days a week for at least 30 minutes.  If you are not already physically active start slow and gradually work up to 30 minutes of moderate physical activity.  Examples of moderate activity include walking briskly, mowing the yard, dancing, swimming, bicycling, etc.  Eat a healthy diet-  Eat a variety of healthy food such as fruits, vegetables, low fat milk, low fat cheese, yogurt, lean meant, poultry, fish, beans, tofu, etc. For more information go to www.thenutritionsource.org  Drink alcohol in moderation- Limit alcohol intake to less than two drinks a day. Never drink and drive.  Dentist- Brush and floss twice daily; visit your dentist twice a year.  Depression- Your  emotional health is as important as your physical health. If you're feeling down, or losing interest in things you would normally enjoy please talk to your healthcare provider.  Eye exam- Visit your eye doctor every year.  Safe sex- If you may be exposed to a sexually transmitted infection, use a condom.  Seat belts- Seat belts can save your life; always wear one.  Smoke/Carbon Monoxide detectors- These detectors need to be installed on the appropriate level of your home.  Replace batteries at least once a year.  Skin cancer- When out in the sun, cover up and use sunscreen 15 SPF or higher.  Violence- If anyone is threatening you, please tell your healthcare provider.  Living Will/ Health care power of attorney- Speak with your healthcare provider and family.    IF you received an x-ray today, you will receive an invoice from [facility]. Please contact [facility] at [phone] with questions or concerns regarding your invoice.   IF you received labwork today, you will receive an invoice from [facility]. Please contact [facility] at [phone] with questions or concerns regarding your invoice.   Our billing staff will not be able to assist you with questions regarding bills from these companies.  You will be contacted with the lab results as soon as they are available. The fastest way to get your results is to activate your My Chart account. Instructions are located on the last page of this paperwork. If you have not heard from [place] regarding the results in 2 weeks, please contact this office.

## 2016-11-28 LAB — LIPID PANEL
CHOL/HDL RATIO: 3.5 ratio (ref 0.0–5.0)
CHOLESTEROL TOTAL: 181 mg/dL (ref 100–199)
HDL: 51 mg/dL (ref >39)
LDL CALC: 93 mg/dL (ref 0–99)
Triglycerides: 185 mg/dL — ABNORMAL HIGH (ref 0–149)
VLDL Cholesterol Cal: 37 mg/dL (ref 5–40)

## 2016-11-28 LAB — PSA: Prostate Specific Ag, Serum: 0.7 ng/mL (ref 0.0–4.0)

## 2016-11-28 LAB — COMPREHENSIVE METABOLIC PANEL
ALT: 16 IU/L (ref 0–44)
AST: 15 IU/L (ref 0–40)
Albumin/Globulin Ratio: 1.7 (ref 1.2–2.2)
Albumin: 4.6 g/dL (ref 3.5–5.5)
Alkaline Phosphatase: 70 IU/L (ref 39–117)
BUN/Creatinine Ratio: 15 (ref 9–20)
BUN: 12 mg/dL (ref 6–24)
Bilirubin Total: 0.5 mg/dL (ref 0.0–1.2)
CALCIUM: 9.4 mg/dL (ref 8.7–10.2)
CO2: 22 mmol/L (ref 20–29)
CREATININE: 0.8 mg/dL (ref 0.76–1.27)
Chloride: 103 mmol/L (ref 96–106)
GFR calc Af Amer: 119 mL/min/{1.73_m2} (ref >59)
GFR, EST NON AFRICAN AMERICAN: 103 mL/min/{1.73_m2} (ref >59)
GLOBULIN, TOTAL: 2.7 g/dL (ref 1.5–4.5)
Glucose: 91 mg/dL (ref 65–99)
Potassium: 4.4 mmol/L (ref 3.5–5.2)
Sodium: 142 mmol/L (ref 134–144)
Total Protein: 7.3 g/dL (ref 6.0–8.5)

## 2016-11-28 LAB — MICROALBUMIN / CREATININE URINE RATIO
Creatinine, Urine: 100.1 mg/dL
MICROALB/CREAT RATIO: 4 mg/g{creat} (ref 0.0–30.0)
MICROALBUM., U, RANDOM: 4 ug/mL

## 2016-11-28 LAB — CBC
HEMATOCRIT: 46.9 % (ref 37.5–51.0)
HEMOGLOBIN: 15.7 g/dL (ref 13.0–17.7)
MCH: 31 pg (ref 26.6–33.0)
MCHC: 33.5 g/dL (ref 31.5–35.7)
MCV: 93 fL (ref 79–97)
Platelets: 305 10*3/uL (ref 150–379)
RBC: 5.06 x10E6/uL (ref 4.14–5.80)
RDW: 13.9 % (ref 12.3–15.4)
WBC: 7.1 10*3/uL (ref 3.4–10.8)

## 2016-11-28 LAB — HEMOGLOBIN A1C
Est. average glucose Bld gHb Est-mCnc: 120 mg/dL
Hgb A1c MFr Bld: 5.8 % — ABNORMAL HIGH (ref 4.8–5.6)

## 2016-11-28 LAB — TSH: TSH: 1.88 u[IU]/mL (ref 0.450–4.500)

## 2016-12-15 ENCOUNTER — Other Ambulatory Visit: Payer: Self-pay | Admitting: Family Medicine

## 2016-12-15 DIAGNOSIS — E785 Hyperlipidemia, unspecified: Secondary | ICD-10-CM

## 2016-12-18 ENCOUNTER — Encounter: Payer: Self-pay | Admitting: Family Medicine

## 2016-12-18 ENCOUNTER — Ambulatory Visit (INDEPENDENT_AMBULATORY_CARE_PROVIDER_SITE_OTHER): Payer: Self-pay | Admitting: Family Medicine

## 2016-12-18 VITALS — BP 122/82 | HR 76 | Temp 98.4°F | Resp 16 | Ht 68.5 in | Wt 214.0 lb

## 2016-12-18 DIAGNOSIS — Z024 Encounter for examination for driving license: Secondary | ICD-10-CM

## 2016-12-18 DIAGNOSIS — E782 Mixed hyperlipidemia: Secondary | ICD-10-CM

## 2016-12-18 DIAGNOSIS — Z6832 Body mass index (BMI) 32.0-32.9, adult: Secondary | ICD-10-CM

## 2016-12-18 DIAGNOSIS — I10 Essential (primary) hypertension: Secondary | ICD-10-CM

## 2016-12-18 DIAGNOSIS — E6609 Other obesity due to excess calories: Secondary | ICD-10-CM

## 2016-12-18 NOTE — Progress Notes (Signed)
Subjective:    Patient ID: Bryce Bush, male    DOB: [birth year], 52 y.o.   MRN: [id number]  12/18/2016  Employment Physical (DOT )   HPI This 51 y.o. male presents for evaluation of DOT physical.  Last DOT physical on 01/01/2016.  Denies history of AMI/CAD, CVA, seizures, chronic pain syndrome, neuropathy.   Visual Acuity Screening   Right eye Left eye Both eyes  Without correction: 20/20 20/20 20/20   With correction:     Hearing Screening Comments: Whisper Test:  Left-66ft Good  Right- 70ft Good    BP Readings from Last 3 Encounters:  12/18/16 122/82  11/27/16 (!) 146/91  01/01/16 136/88   Wt Readings from Last 3 Encounters:  12/18/16 214 lb (97.1 kg)  11/27/16 220 lb 6.4 oz (100 kg)  01/01/16 218 lb (98.9 kg)   Immunization History  Administered Date(s) Administered  . Influenza,inj,Quad PF,6+ Mos 12/22/2013, 11/22/2015, 11/27/2016  . Tdap 11/02/2014    Review of Systems  Constitutional: Negative for activity change, appetite change, chills, diaphoresis, fatigue, fever and unexpected weight change.  HENT: Negative for congestion, dental problem, drooling, ear discharge, ear pain, facial swelling, hearing loss, mouth sores, nosebleeds, postnasal drip, rhinorrhea, sinus pressure, sneezing, sore throat, tinnitus, trouble swallowing and voice change.   Eyes: Negative for photophobia, pain, discharge, redness, itching and visual disturbance.  Respiratory: Negative for apnea, cough, choking, chest tightness, shortness of breath, wheezing and stridor.   Cardiovascular: Negative for chest pain, palpitations and leg swelling.  Gastrointestinal: Negative for abdominal pain, blood in stool, constipation, diarrhea, nausea and vomiting.  Endocrine: Negative for cold intolerance, heat intolerance, polydipsia, polyphagia and polyuria.  Genitourinary: Negative for decreased urine volume, difficulty urinating, discharge, dysuria, enuresis, flank pain, frequency, genital sores,  hematuria, penile pain, penile swelling, scrotal swelling, testicular pain and urgency.       Nocturia x 3.  Musculoskeletal: Negative for arthralgias, back pain, gait problem, joint swelling, myalgias, neck pain and neck stiffness.  Skin: Negative for color change, pallor, rash and wound.  Allergic/Immunologic: Negative for environmental allergies, food allergies and immunocompromised state.  Neurological: Negative for dizziness, tremors, seizures, syncope, facial asymmetry, speech difficulty, weakness, light-headedness, numbness and headaches.  Hematological: Negative for adenopathy. Does not bruise/bleed easily.  Psychiatric/Behavioral: Negative for agitation, behavioral problems, confusion, decreased concentration, dysphoric mood, hallucinations, self-injury, sleep disturbance and suicidal ideas. The patient is not nervous/anxious and is not hyperactive.     Past Medical History:  Diagnosis Date  . Hypercholesteremia   . Hypertension    Past Surgical History:  Procedure Laterality Date  . APPENDECTOMY    . SPINE SURGERY     Cervical spine surgery  . TONSILLECTOMY     No Known Allergies Current Outpatient Prescriptions  Medication Sig Dispense Refill  . aspirin 81 MG chewable tablet Chew 81 mg by mouth daily.    Marland Kitchen atorvastatin (LIPITOR) 40 MG tablet TAKE ONE TABLET BY MOUTH ONCE DAILY 90 tablet 3  . fish oil-omega-3 fatty acids 1000 MG capsule Take 1 g by mouth daily.    Marland Kitchen lisinopril (PRINIVIL,ZESTRIL) 10 MG tablet Take 1 tablet (10 mg total) by mouth daily. 90 tablet 1  . LYSINE PO Take 1 tablet by mouth daily.     No current facility-administered medications for this visit.    Social History   Social History  . Marital status: Single    Spouse name: N/A  . Number of children: 2  . Years of education: N/A   Occupational History  .  truck driver     [employer]   Social History Main Topics  . Smoking status: Never Smoker  . Smokeless tobacco: Never Used  . Alcohol use No    . Drug use: No  . Sexual activity: Yes   Other Topics Concern  . Not on file   Social History Narrative   Marital status: Single; not dating      Children: 2 children/adopted.  No grandchildren.      Education: [employer].      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      Tobacco: none      Alcohol: none      Drugs: none      Exercise:  Weight room in house; lifts weights.      Seatbelt: 100%; no texting      Sexual activity: none in past year; no STDs.  Females only.           Family History  Problem Relation Age of Onset  . Cancer Father 51       throat  . Heart disease Father 34       AMI/CABG/valve replacement  . Hyperlipidemia Mother   . Hypertension Mother   . COPD Brother   . Graves' disease Brother   . Alcohol abuse Brother   . Colon cancer Neg Hx        Objective:    BP 122/82   Pulse 76   Temp 98.4 F (36.9 C) (Oral)   Resp 16   Ht 5' 8.5" (1.74 m)   Wt 214 lb (97.1 kg)   SpO2 99%   BMI 32.06 kg/m  Physical Exam  Constitutional: He is oriented to person, place, and time. He appears well-developed and well-nourished. No distress.  HENT:  Head: Normocephalic and atraumatic.  Right Ear: External ear normal.  Left Ear: External ear normal.  Nose: Nose normal.  Mouth/Throat: Oropharynx is clear and moist.  Eyes: Pupils are equal, round, and reactive to light. Conjunctivae and EOM are normal.  Neck: Normal range of motion. Neck supple. Carotid bruit is not present. No thyromegaly present.  Cardiovascular: Normal rate, regular rhythm, normal heart sounds and intact distal pulses.  Exam reveals no gallop and no friction rub.   No murmur heard. Pulmonary/Chest: Effort normal and breath sounds normal. He has no wheezes. He has no rales.  Abdominal: Soft. Bowel sounds are normal. He exhibits no distension and no mass. There is no tenderness. There is no rebound and no guarding. Hernia confirmed negative in  the right inguinal area and confirmed negative in the left inguinal area.  Genitourinary: Testes normal and penis normal.  Musculoskeletal:       Right shoulder: Normal.       Left shoulder: Normal.       Right knee: Normal.       Left knee: Normal.       Right ankle: Normal.       Left ankle: Normal.       Cervical back: Normal.       Thoracic back: Normal.       Lumbar back: Normal.       Right foot: Normal.       Left foot: Normal.  Lymphadenopathy:    He has no cervical adenopathy.       Right: No inguinal adenopathy present.       Left: No inguinal adenopathy present.  Neurological:  He is alert and oriented to person, place, and time. He has normal reflexes. No cranial nerve deficit. He exhibits normal muscle tone. Coordination normal.  Skin: Skin is warm and dry. No rash noted. He is not diaphoretic.  Psychiatric: He has a normal mood and affect. His behavior is normal. Judgment and thought content normal.   Results for orders placed or performed in visit on 11/27/16  PSA  Result Value Ref Range   Prostate Specific Ag, Serum 0.7 0.0 - 4.0 ng/mL  Lipid panel  Result Value Ref Range   Cholesterol, Total 181 100 - 199 mg/dL   Triglycerides 185 (H) 0 - 149 mg/dL   HDL 51 >39 mg/dL   VLDL Cholesterol Cal 37 5 - 40 mg/dL   LDL Calculated 93 0 - 99 mg/dL   Chol/HDL Ratio 3.5 0.0 - 5.0 ratio  Comprehensive metabolic panel  Result Value Ref Range   Glucose 91 65 - 99 mg/dL   BUN 12 6 - 24 mg/dL   Creatinine, Ser 0.80 0.76 - 1.27 mg/dL   GFR calc non Af Amer 103 >59 mL/min/1.73   GFR calc Af Amer 119 >59 mL/min/1.73   BUN/Creatinine Ratio 15 9 - 20   Sodium 142 134 - 144 mmol/L   Potassium 4.4 3.5 - 5.2 mmol/L   Chloride 103 96 - 106 mmol/L   CO2 22 20 - 29 mmol/L   Calcium 9.4 8.7 - 10.2 mg/dL   Total Protein 7.3 6.0 - 8.5 g/dL   Albumin 4.6 3.5 - 5.5 g/dL   Globulin, Total 2.7 1.5 - 4.5 g/dL   Albumin/Globulin Ratio 1.7 1.2 - 2.2   Bilirubin Total 0.5 0.0 - 1.2 mg/dL    Alkaline Phosphatase 70 39 - 117 IU/L   AST 15 0 - 40 IU/L   ALT 16 0 - 44 IU/L  CBC  Result Value Ref Range   WBC 7.1 3.4 - 10.8 x10E3/uL   RBC 5.06 4.14 - 5.80 x10E6/uL   Hemoglobin 15.7 13.0 - 17.7 g/dL   Hematocrit 46.9 37.5 - 51.0 %   MCV 93 79 - 97 fL   MCH 31.0 26.6 - 33.0 pg   MCHC 33.5 31.5 - 35.7 g/dL   RDW 13.9 12.3 - 15.4 %   Platelets 305 150 - 379 x10E3/uL  TSH  Result Value Ref Range   TSH 1.880 0.450 - 4.500 uIU/mL  Microalbumin / creatinine urine ratio  Result Value Ref Range   Creatinine, Urine 100.1 Not Estab. mg/dL   Albumin, Urine 4.0 Not Estab. ug/mL   Microalb/Creat Ratio 4.0 0.0 - 30.0 mg/g creat  Hemoglobin A1c  Result Value Ref Range   Hgb A1c MFr Bld 5.8 (H) 4.8 - 5.6 %   Est. average glucose Bld gHb Est-mCnc 120 mg/dL   No results found. Depression screen [facility] 2/9 12/18/2016 11/27/2016 11/22/2015 09/05/2015 04/12/2015  Decreased Interest 0 0 0 0 0  Down, Depressed, Hopeless 0 0 0 0 0  PHQ - 2 Score 0 0 0 0 0   Fall Risk  12/18/2016 11/27/2016 11/22/2015 09/05/2015 04/12/2015  Falls in the past year? No No No No No        Assessment & Plan:   1. 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 Essential hypertension   3. Mixed hyperlipidemia   4. Class 1 obesity due to excess calories with serious comorbidity and body mass index (BMI) of 32.0 to 32.9 in adult    -  one year card provided due to hypertension. -recommend weight loss, exercise for 30-60 minutes five days per week; recommend 1200 kcal restriction per day with a minimum of 60 grams of protein per day. -blood pressure controlled and followed by PA Mani.   No orders of the defined types were placed in this encounter.  No orders of the defined types were placed in this encounter.   Return in about 1 year (around 12/18/2017) for DOT physical.   Norwood Levo, M.D. Primary Care at [facility] [place] [address] [place], [place]  [address] [phone] phone [phone] fax

## 2016-12-18 NOTE — Patient Instructions (Signed)
     IF you received an x-ray today, you will receive an invoice from [facility]. Please contact [facility] at [phone] with questions or concerns regarding your invoice.   IF you received labwork today, you will receive an invoice from [facility]. Please contact [facility] at [phone] with questions or concerns regarding your invoice.   Our billing staff will not be able to assist you with questions regarding bills from these companies.  You will be contacted with the lab results as soon as they are available. The fastest way to get your results is to activate your My Chart account. Instructions are located on the last page of this paperwork. If you have not heard from us regarding the results in 2 weeks, please contact this office.     

## 2016-12-22 DIAGNOSIS — E6609 Other obesity due to excess calories: Secondary | ICD-10-CM | POA: Insufficient documentation

## 2016-12-22 DIAGNOSIS — Z6832 Body mass index (BMI) 32.0-32.9, adult: Secondary | ICD-10-CM

## 2016-12-22 DIAGNOSIS — E669 Obesity, unspecified: Secondary | ICD-10-CM | POA: Insufficient documentation

## 2016-12-22 HISTORY — DX: Other obesity due to excess calories: E66.09

## 2016-12-26 ENCOUNTER — Encounter: Payer: Self-pay | Admitting: Urgent Care

## 2016-12-26 ENCOUNTER — Ambulatory Visit (INDEPENDENT_AMBULATORY_CARE_PROVIDER_SITE_OTHER): Payer: Managed Care, Other (non HMO) | Admitting: Urgent Care

## 2016-12-26 VITALS — BP 121/83 | HR 70 | Temp 98.4°F | Resp 16 | Ht 68.5 in | Wt 209.6 lb

## 2016-12-26 DIAGNOSIS — L739 Follicular disorder, unspecified: Secondary | ICD-10-CM

## 2016-12-26 DIAGNOSIS — I10 Essential (primary) hypertension: Secondary | ICD-10-CM

## 2016-12-26 MED ORDER — MUPIROCIN 2 % EX OINT: 1.0000 "application " | TOPICAL_OINTMENT | Freq: Three times a day (TID) | CUTANEOUS | 1 refills | Status: DC

## 2016-12-26 NOTE — Progress Notes (Signed)
   MRN: [id number] DOB: 1965-06-12  Subjective:   Bryce Bush is a 51 y.o. male presenting for follow up on Hypertension.   Currently managed with lisinopril. Patient is checking blood pressure at home, generally 329'J systolic. Avoids salt in diet, is not exercising. Reports that he has made significant dietary changes. Denies dizziness, chronic headache, blurred vision, chest pain, shortness of breath, heart racing, palpitations, nausea, vomiting, abdominal pain, hematuria, lower leg swelling. Denies smoking cigarettes or drinking alcohol.   Bryce Bush has a current medication list which includes the following prescription(s): aspirin, atorvastatin, fish oil-omega-3 fatty acids, lisinopril, and lysine. Also has No Known Allergies.  Bryce Bush  has a past medical history of Hypercholesteremia and Hypertension. Also  has a past surgical history that includes Appendectomy; Tonsillectomy; and Spine surgery.  Objective:   Vitals: Pulse 70   Temp 98.4 F (36.9 C) (Oral)   Resp 16   Ht 5' 8.5" (1.74 m)   Wt 209 lb 9.6 oz (95.1 kg)   SpO2 97%   BMI 31.41 kg/m   BP today was 121/83.   BP Readings from Last 3 Encounters:  12/18/16 122/82  11/27/16 (!) 146/91  01/01/16 136/88   Wt Readings from Last 3 Encounters:  12/26/16 209 lb 9.6 oz (95.1 kg)  12/18/16 214 lb (97.1 kg)  11/27/16 220 lb 6.4 oz (100 kg)   Physical Exam  Constitutional: He is oriented to person, place, and time. He appears well-developed and well-nourished.  HENT:  Mouth/Throat: Oropharynx is clear and moist.  Cardiovascular: Normal rate, regular rhythm and intact distal pulses.  Exam reveals no gallop and no friction rub.   No murmur heard. Pulmonary/Chest: No respiratory distress. He has no wheezes. He has no rales.  Musculoskeletal: He exhibits no edema.  Neurological: He is alert and oriented to person, place, and time.  Skin: Skin is warm and dry.  Psychiatric: He has a normal mood and affect.   Assessment and Plan :    1. Essential hypertension - Well controlled. Continue healthy lifestyle. Follow up in 6 months.  2. Folliculitis - Start Bactroban for lesion on arm, rtc if he fails to achieve resolution.   [name], PA-C [facility] [phone] 12/26/2016  8:47 AM

## 2016-12-26 NOTE — Patient Instructions (Addendum)
Hypertension Hypertension, commonly called high blood pressure, is when the force of blood pumping through the arteries is too strong. The arteries are the blood vessels that carry blood from the heart throughout the body. Hypertension forces the heart to work harder to pump blood and may cause arteries to become narrow or stiff. Having untreated or uncontrolled hypertension can cause heart attacks, strokes, kidney disease, and other problems. A blood pressure reading consists of a higher number over a lower number. Ideally, your blood pressure should be below 120/80. The first ("top") number is called the systolic pressure. It is a measure of the pressure in your arteries as your heart beats. The second ("bottom") number is called the diastolic pressure. It is a measure of the pressure in your arteries as the heart relaxes. What are the causes? The cause of this condition is not known. What increases the risk? Some risk factors for high blood pressure are under your control. Others are not. Factors you can change  Smoking.  Having type 2 diabetes mellitus, high cholesterol, or both.  Not getting enough exercise or physical activity.  Being overweight.  Having too much fat, sugar, calories, or salt (sodium) in your diet.  Drinking too much alcohol. Factors that are difficult or impossible to change  Having chronic kidney disease.  Having a family history of high blood pressure.  Age. Risk increases with age.  Race. You may be at higher risk if you are African-American.  Gender. Men are at higher risk than women before age 45. After age 65, women are at higher risk than men.  Having obstructive sleep apnea.  Stress. What are the signs or symptoms? Extremely high blood pressure (hypertensive crisis) may cause:  Headache.  Anxiety.  Shortness of breath.  Nosebleed.  Nausea and vomiting.  Severe chest pain.  Jerky movements you cannot control (seizures).  How is this  diagnosed? This condition is diagnosed by measuring your blood pressure while you are seated, with your arm resting on a surface. The cuff of the blood pressure monitor will be placed directly against the skin of your upper arm at the level of your heart. It should be measured at least twice using the same arm. Certain conditions can cause a difference in blood pressure between your right and left arms. Certain factors can cause blood pressure readings to be lower or higher than normal (elevated) for a short period of time:  When your blood pressure is higher when you are in a health care provider's office than when you are at home, this is called white coat hypertension. Most people with this condition do not need medicines.  When your blood pressure is higher at home than when you are in a health care provider's office, this is called masked hypertension. Most people with this condition may need medicines to control blood pressure.  If you have a high blood pressure reading during one visit or you have normal blood pressure with other risk factors:  You may be asked to return on a different day to have your blood pressure checked again.  You may be asked to monitor your blood pressure at home for 1 week or longer.  If you are diagnosed with hypertension, you may have other blood or imaging tests to help your health care provider understand your overall risk for other conditions. How is this treated? This condition is treated by making healthy lifestyle changes, such as eating healthy foods, exercising more, and reducing your alcohol intake. Your   health care provider may prescribe medicine if lifestyle changes are not enough to get your blood pressure under control, and if:  Your systolic blood pressure is above 130.  Your diastolic blood pressure is above 80.  Your personal target blood pressure may vary depending on your medical conditions, your age, and other factors. Follow these  instructions at home: Eating and drinking  Eat a diet that is high in fiber and potassium, and low in sodium, added sugar, and fat. An example eating plan is called the DASH (Dietary Approaches to Stop Hypertension) diet. To eat this way: ? Eat plenty of fresh fruits and vegetables. Try to fill half of your plate at each meal with fruits and vegetables. ? Eat whole grains, such as whole wheat pasta, brown rice, or whole grain bread. Fill about one quarter of your plate with whole grains. ? Eat or drink low-fat dairy products, such as skim milk or low-fat yogurt. ? Avoid fatty cuts of meat, processed or cured meats, and poultry with skin. Fill about one quarter of your plate with lean proteins, such as fish, chicken without skin, beans, eggs, and tofu. ? Avoid premade and processed foods. These tend to be higher in sodium, added sugar, and fat.  Reduce your daily sodium intake. Most people with hypertension should eat less than 1,500 mg of sodium a day.  Limit alcohol intake to no more than 1 drink a day for nonpregnant women and 2 drinks a day for men. One drink equals 12 oz of beer, 5 oz of wine, or 1 oz of hard liquor. Lifestyle  Work with your health care provider to maintain a healthy body weight or to lose weight. Ask what an ideal weight is for you.  Get at least 30 minutes of exercise that causes your heart to beat faster (aerobic exercise) most days of the week. Activities may include walking, swimming, or biking.  Include exercise to strengthen your muscles (resistance exercise), such as pilates or lifting weights, as part of your weekly exercise routine. Try to do these types of exercises for 30 minutes at least 3 days a week.  Do not use any products that contain nicotine or tobacco, such as cigarettes and e-cigarettes. If you need help quitting, ask your health care provider.  Monitor your blood pressure at home as told by your health care provider.  Keep all follow-up visits as  told by your health care provider. This is important. Medicines  Take over-the-counter and prescription medicines only as told by your health care provider. Follow directions carefully. Blood pressure medicines must be taken as prescribed.  Do not skip doses of blood pressure medicine. Doing this puts you at risk for problems and can make the medicine less effective.  Ask your health care provider about side effects or reactions to medicines that you should watch for. Contact a health care provider if:  You think you are having a reaction to a medicine you are taking.  You have headaches that keep coming back (recurring).  You feel dizzy.  You have swelling in your ankles.  You have trouble with your vision. Get help right away if:  You develop a severe headache or confusion.  You have unusual weakness or numbness.  You feel faint.  You have severe pain in your chest or abdomen.  You vomit repeatedly.  You have trouble breathing. Summary  Hypertension is when the force of blood pumping through your arteries is too strong. If this condition is not   controlled, it may put you at risk for serious complications.  Your personal target blood pressure may vary depending on your medical conditions, your age, and other factors. For most people, a normal blood pressure is less than 120/80.  Hypertension is treated with lifestyle changes, medicines, or a combination of both. Lifestyle changes include weight loss, eating a healthy, low-sodium diet, exercising more, and limiting alcohol. This information is not intended to replace advice given to you by your health care provider. Make sure you discuss any questions you have with your health care provider. Document Released: 03/11/2005 Document Revised: 02/07/2016 Document Reviewed: 02/07/2016 Elsevier Interactive Patient Education  2018 [employer]     IF you received an x-ray today, you will receive an invoice from [facility]. Please contact [facility] at [phone] with questions or concerns regarding your invoice.   IF you received labwork today, you will receive an invoice from [facility]. Please contact [facility] at [phone] with questions or concerns regarding your invoice.   Our billing staff will not be able to assist you with questions regarding bills from these companies.  You will be contacted with the lab results as soon as they are available. The fastest way to get your results is to activate your My Chart account. Instructions are located on the last page of this paperwork. If you have not heard from us regarding the results in 2 weeks, please contact this office.     

## 2017-06-15 ENCOUNTER — Other Ambulatory Visit: Payer: Self-pay | Admitting: Urgent Care

## 2017-06-15 DIAGNOSIS — I10 Essential (primary) hypertension: Secondary | ICD-10-CM

## 2017-08-13 ENCOUNTER — Encounter: Payer: Self-pay | Admitting: Urgent Care

## 2017-08-13 ENCOUNTER — Ambulatory Visit (INDEPENDENT_AMBULATORY_CARE_PROVIDER_SITE_OTHER): Payer: 59 | Admitting: Urgent Care

## 2017-08-13 VITALS — BP 149/79 | HR 69 | Temp 98.2°F | Resp 17 | Ht 68.5 in | Wt 221.0 lb

## 2017-08-13 DIAGNOSIS — Z6833 Body mass index (BMI) 33.0-33.9, adult: Secondary | ICD-10-CM

## 2017-08-13 DIAGNOSIS — I10 Essential (primary) hypertension: Secondary | ICD-10-CM | POA: Diagnosis not present

## 2017-08-13 DIAGNOSIS — R0789 Other chest pain: Secondary | ICD-10-CM | POA: Diagnosis not present

## 2017-08-13 DIAGNOSIS — E669 Obesity, unspecified: Secondary | ICD-10-CM | POA: Diagnosis not present

## 2017-08-13 DIAGNOSIS — E782 Mixed hyperlipidemia: Secondary | ICD-10-CM

## 2017-08-13 DIAGNOSIS — J069 Acute upper respiratory infection, unspecified: Secondary | ICD-10-CM

## 2017-08-13 MED ORDER — CETIRIZINE HCL 10 MG PO TABS: 10.0000 mg | ORAL_TABLET | Freq: Every day | ORAL | 11 refills | Status: DC

## 2017-08-13 MED ORDER — LISINOPRIL 20 MG PO TABS: 10.0000 mg | ORAL_TABLET | Freq: Every day | ORAL | 1 refills | Status: DC

## 2017-08-13 NOTE — Progress Notes (Signed)
MRN: [id number] DOB: Oct 09, 1965  Subjective:   Bryce Bush is a 52 y.o. male presenting for follow up on Hypertension and HL. Currently managed with lisinopril for HTN and atorvastatin for HL. Patient is checking blood pressure at home occasionally, generally 665'L systolic. Diet is non-compliant. Reports 1 week history of illness. Had watery left eye, runny nose, right sided chest pain with yawning. Had tried Mucinex, benadryl, Zyrtec. Admits that he feels better now. Denies dizziness, sinus pain, ear pain, sore throat, chronic headache, blurred vision, cough, shortness of breath, heart racing, palpitations, nausea, vomiting, abdominal pain, hematuria, lower leg swelling. Denies smoking cigarettes or drinking alcohol.   Bryce Bush has a current medication list which includes the following prescription(s): aspirin, atorvastatin, fish oil-omega-3 fatty acids, lisinopril, lysine, and mupirocin ointment. Also has No Known Allergies.  Bryce Bush  has a past medical history of Hypercholesteremia and Hypertension. Also  has a past surgical history that includes Appendectomy; Tonsillectomy; and Spine surgery.  Objective:   Vitals: BP (!) 149/79   Pulse 69   Temp 98.2 F (36.8 C) (Oral)   Resp 17   Ht 5' 8.5" (1.74 m)   Wt 221 lb (100.2 kg)   SpO2 98%   BMI 33.11 kg/m   BP Readings from Last 3 Encounters:  08/13/17 (!) 149/79  12/26/16 121/83  12/18/16 122/82    Wt Readings from Last 3 Encounters:  08/13/17 221 lb (100.2 kg)  12/26/16 209 lb 9.6 oz (95.1 kg)  12/18/16 214 lb (97.1 kg)    Physical Exam  Constitutional: He is oriented to person, place, and time. He appears well-developed and well-nourished.  HENT:  Right Ear: Tympanic membrane normal.  Left Ear: Tympanic membrane normal.  Nose: No mucosal edema or rhinorrhea.    Mouth/Throat: Oropharynx is clear and moist.  Eyes: Right eye exhibits no discharge. Left eye exhibits no discharge. No scleral icterus.  Cardiovascular: Normal rate,  regular rhythm and intact distal pulses. Exam reveals no gallop and no friction rub.  No murmur heard. Pulmonary/Chest: No respiratory distress. He has no wheezes. He has no rales.  Abdominal: Soft. Bowel sounds are normal. He exhibits no distension and no mass. There is no tenderness. There is no rebound and no guarding.  Musculoskeletal: He exhibits no edema.  Neurological: He is alert and oriented to person, place, and time.  Skin: Skin is warm and dry.  Psychiatric: He has a normal mood and affect.   ECG interpretation - sinus rhythm at 64 bpm.  Assessment and Plan :   Essential hypertension - Plan: Basic metabolic panel, EKG 93-TTSV, lisinopril (PRINIVIL,ZESTRIL) 20 MG tablet  Atypical chest pain  Mixed hyperlipidemia - Plan: Lipid panel  Class 1 obesity with serious comorbidity and body mass index (BMI) of 33.0 to 33.9 in adult, unspecified obesity type - Plan: Hemoglobin A1c  Viral URI  Refilled his lisinopril at 20 mg.  Counseled on need for lifestyle modifications including healthier diet.  We will hold off on cholesterol medication refills pending his lab results.  Counseled on supportive care for what I suspect was a viral URI.  I also believe the patient dried out his skin from his drainage, runny nose and blowing his nose using clinics.  Counseled on supportive care with this as well.  ECG is reassuring, his atypical chest pain may have been a transient symptom related to his viral URI.  Will follow-up as needed.  [name], PA-C [facility] [phone] 08/13/2017  2:32 PM

## 2017-08-13 NOTE — Patient Instructions (Addendum)
Hydrate well with at least 2 liters (1 gallon) of water daily.    Salads - kale, spinach, cabbage, spring mix; use seeds like pumpkin seeds or sunflower seeds; you can also use 1-2 hard boiled eggs. Fruits - avocadoes, berries (blueberries, raspberries, blackberries), apples, oranges, pomegranate, grapefruit Seeds - quinoa, chia seeds; you can also incorporate oatmeal Vegetables - aspargus, cauliflower, broccoli, green beans, brussel spouts, bell peppers; stay away from starchy vegetables like potatoes, carrots, peas  Brussel sprouts - Cut off stems. Place in a mixing bowl that has a lid. Pour in a 1/4-1/2 cup olive oil, spices, use a light amount of parmesan. Place on a baking sheet. Bake for 10 minutes at 400F. Take it out, eat the brussel chips. Place for another 5-10 minutes.   Mashed cauliflower - Boil a bunch of cauliflower in a pot of water. Blend in a food processor with 1-2 tablespoons of butter.  Spaghetti squash -  Cut the squash in half very carefully, clean out seeds from the middle. Place 1/2 face down in a microwave safe dish with at least 2 inches of water. Make 4-6 slits on outside of spaghetti squash and microwave for 10-12 minutes. Take out the spaghetti using a metal spoon. Repeat for the other half.   Vega protein is good protein powder, make sure you use ~6 ice cubes to give it smoothie consistency together with ~4-6 ounces of vanilla soy milk. Throw cinnamon into your shake, use peanut butter. You can also use the fruits that I listed above. Throw spinach or kale into the shake.       Hypertension Hypertension, commonly called high blood pressure, is when the force of blood pumping through the arteries is too strong. The arteries are the blood vessels that carry blood from the heart throughout the body. Hypertension forces the heart to work harder to pump blood and may cause arteries to become narrow or stiff. Having untreated or uncontrolled hypertension can cause heart  attacks, strokes, kidney disease, and other problems. A blood pressure reading consists of a higher number over a lower number. Ideally, your blood pressure should be below 120/80. The first ("top") number is called the systolic pressure. It is a measure of the pressure in your arteries as your heart beats. The second ("bottom") number is called the diastolic pressure. It is a measure of the pressure in your arteries as the heart relaxes. What are the causes? The cause of this condition is not known. What increases the risk? Some risk factors for high blood pressure are under your control. Others are not. Factors you can change  Smoking.  Having type 2 diabetes mellitus, high cholesterol, or both.  Not getting enough exercise or physical activity.  Being overweight.  Having too much fat, sugar, calories, or salt (sodium) in your diet.  Drinking too much alcohol. Factors that are difficult or impossible to change  Having chronic kidney disease.  Having a family history of high blood pressure.  Age. Risk increases with age.  Race. You may be at higher risk if you are African-American.  Gender. Men are at higher risk than women before age 57. After age 18, women are at higher risk than men.  Having obstructive sleep apnea.  Stress. What are the signs or symptoms? Extremely high blood pressure (hypertensive crisis) may cause:  Headache.  Anxiety.  Shortness of breath.  Nosebleed.  Nausea and vomiting.  Severe chest pain.  Jerky movements you cannot control (seizures).  How is this  diagnosed? This condition is diagnosed by measuring your blood pressure while you are seated, with your arm resting on a surface. The cuff of the blood pressure monitor will be placed directly against the skin of your upper arm at the level of your heart. It should be measured at least twice using the same arm. Certain conditions can cause a difference in blood pressure between your right and  left arms. Certain factors can cause blood pressure readings to be lower or higher than normal (elevated) for a short period of time:  When your blood pressure is higher when you are in a health care provider's office than when you are at home, this is called white coat hypertension. Most people with this condition do not need medicines.  When your blood pressure is higher at home than when you are in a health care provider's office, this is called masked hypertension. Most people with this condition may need medicines to control blood pressure.  If you have a high blood pressure reading during one visit or you have normal blood pressure with other risk factors:  You may be asked to return on a different day to have your blood pressure checked again.  You may be asked to monitor your blood pressure at home for 1 week or longer.  If you are diagnosed with hypertension, you may have other blood or imaging tests to help your health care provider understand your overall risk for other conditions. How is this treated? This condition is treated by making healthy lifestyle changes, such as eating healthy foods, exercising more, and reducing your alcohol intake. Your health care provider may prescribe medicine if lifestyle changes are not enough to get your blood pressure under control, and if:  Your systolic blood pressure is above 130.  Your diastolic blood pressure is above 80.  Your personal target blood pressure may vary depending on your medical conditions, your age, and other factors. Follow these instructions at home: Eating and drinking  Eat a diet that is high in fiber and potassium, and low in sodium, added sugar, and fat. An example eating plan is called the DASH (Dietary Approaches to Stop Hypertension) diet. To eat this way: ? Eat plenty of fresh fruits and vegetables. Try to fill half of your plate at each meal with fruits and vegetables. ? Eat whole grains, such as whole wheat  pasta, brown rice, or whole grain bread. Fill about one quarter of your plate with whole grains. ? Eat or drink low-fat dairy products, such as skim milk or low-fat yogurt. ? Avoid fatty cuts of meat, processed or cured meats, and poultry with skin. Fill about one quarter of your plate with lean proteins, such as fish, chicken without skin, beans, eggs, and tofu. ? Avoid premade and processed foods. These tend to be higher in sodium, added sugar, and fat.  Reduce your daily sodium intake. Most people with hypertension should eat less than 1,500 mg of sodium a day.  Limit alcohol intake to no more than 1 drink a day for nonpregnant women and 2 drinks a day for men. One drink equals 12 oz of beer, 5 oz of wine, or 1 oz of hard liquor. Lifestyle  Work with your health care provider to maintain a healthy body weight or to lose weight. Ask what an ideal weight is for you.  Get at least 30 minutes of exercise that causes your heart to beat faster (aerobic exercise) most days of the week. Activities may include  walking, swimming, or biking.  Include exercise to strengthen your muscles (resistance exercise), such as pilates or lifting weights, as part of your weekly exercise routine. Try to do these types of exercises for 30 minutes at least 3 days a week.  Do not use any products that contain nicotine or tobacco, such as cigarettes and e-cigarettes. If you need help quitting, ask your health care provider.  Monitor your blood pressure at home as told by your health care provider.  Keep all follow-up visits as told by your health care provider. This is important. Medicines  Take over-the-counter and prescription medicines only as told by your health care provider. Follow directions carefully. Blood pressure medicines must be taken as prescribed.  Do not skip doses of blood pressure medicine. Doing this puts you at risk for problems and can make the medicine less effective.  Ask your health care  provider about side effects or reactions to medicines that you should watch for. Contact a health care provider if:  You think you are having a reaction to a medicine you are taking.  You have headaches that keep coming back (recurring).  You feel dizzy.  You have swelling in your ankles.  You have trouble with your vision. Get help right away if:  You develop a severe headache or confusion.  You have unusual weakness or numbness.  You feel faint.  You have severe pain in your chest or abdomen.  You vomit repeatedly.  You have trouble breathing. Summary  Hypertension is when the force of blood pumping through your arteries is too strong. If this condition is not controlled, it may put you at risk for serious complications.  Your personal target blood pressure may vary depending on your medical conditions, your age, and other factors. For most people, a normal blood pressure is less than 120/80.  Hypertension is treated with lifestyle changes, medicines, or a combination of both. Lifestyle changes include weight loss, eating a healthy, low-sodium diet, exercising more, and limiting alcohol. This information is not intended to replace advice given to you by your health care provider. Make sure you discuss any questions you have with your health care provider. Document Released: 03/11/2005 Document Revised: 02/07/2016 Document Reviewed: 02/07/2016 Elsevier Interactive Patient Education  2018 [employer].     IF you received an x-ray today, you will receive an invoice from [facility]. Please contact [facility] at [phone] with questions or concerns regarding your invoice.   IF you received labwork today, you will receive an invoice from [facility]. Please contact [facility] at [phone] with questions or concerns regarding your invoice.   Our billing staff will not be able to assist you with questions regarding bills from these companies.  You will be  contacted with the lab results as soon as they are available. The fastest way to get your results is to activate your My Chart account. Instructions are located on the last page of this paperwork. If you have not heard from [place] regarding the results in 2 weeks, please contact this office.

## 2017-08-14 LAB — LIPID PANEL
Chol/HDL Ratio: 3.4 ratio (ref 0.0–5.0)
Cholesterol, Total: 156 mg/dL (ref 100–199)
HDL: 46 mg/dL (ref >39)
LDL CALC: 82 mg/dL (ref 0–99)
TRIGLYCERIDES: 141 mg/dL (ref 0–149)
VLDL Cholesterol Cal: 28 mg/dL (ref 5–40)

## 2017-08-14 LAB — BASIC METABOLIC PANEL
BUN/Creatinine Ratio: 17 (ref 9–20)
BUN: 13 mg/dL (ref 6–24)
CHLORIDE: 102 mmol/L (ref 96–106)
CO2: 23 mmol/L (ref 20–29)
CREATININE: 0.78 mg/dL (ref 0.76–1.27)
Calcium: 9.2 mg/dL (ref 8.7–10.2)
GFR calc Af Amer: 120 mL/min/{1.73_m2} (ref >59)
GFR calc non Af Amer: 104 mL/min/{1.73_m2} (ref >59)
Glucose: 81 mg/dL (ref 65–99)
Potassium: 4.6 mmol/L (ref 3.5–5.2)
SODIUM: 143 mmol/L (ref 134–144)

## 2017-08-14 LAB — HEMOGLOBIN A1C
ESTIMATED AVERAGE GLUCOSE: 117 mg/dL
Hgb A1c MFr Bld: 5.7 % — ABNORMAL HIGH (ref 4.8–5.6)

## 2017-08-15 ENCOUNTER — Other Ambulatory Visit: Payer: Self-pay | Admitting: Urgent Care

## 2017-08-15 DIAGNOSIS — I10 Essential (primary) hypertension: Secondary | ICD-10-CM

## 2017-08-18 ENCOUNTER — Encounter: Payer: Self-pay | Admitting: Family Medicine

## 2017-11-28 ENCOUNTER — Encounter: Payer: Self-pay | Admitting: Urgent Care

## 2017-11-28 ENCOUNTER — Ambulatory Visit (INDEPENDENT_AMBULATORY_CARE_PROVIDER_SITE_OTHER): Payer: 59 | Admitting: Urgent Care

## 2017-11-28 VITALS — BP 147/90 | HR 71 | Temp 98.0°F | Resp 18 | Ht 68.5 in | Wt 221.2 lb

## 2017-11-28 DIAGNOSIS — Z23 Encounter for immunization: Secondary | ICD-10-CM

## 2017-11-28 DIAGNOSIS — E6609 Other obesity due to excess calories: Secondary | ICD-10-CM

## 2017-11-28 DIAGNOSIS — I10 Essential (primary) hypertension: Secondary | ICD-10-CM

## 2017-11-28 DIAGNOSIS — Z6833 Body mass index (BMI) 33.0-33.9, adult: Secondary | ICD-10-CM

## 2017-11-28 DIAGNOSIS — Z1329 Encounter for screening for other suspected endocrine disorder: Secondary | ICD-10-CM | POA: Diagnosis not present

## 2017-11-28 DIAGNOSIS — Z131 Encounter for screening for diabetes mellitus: Secondary | ICD-10-CM | POA: Diagnosis not present

## 2017-11-28 DIAGNOSIS — Z130 Encounter for screening for diseases of the blood and blood-forming organs and certain disorders involving the immune mechanism: Secondary | ICD-10-CM | POA: Diagnosis not present

## 2017-11-28 DIAGNOSIS — E782 Mixed hyperlipidemia: Secondary | ICD-10-CM

## 2017-11-28 DIAGNOSIS — Z0000 Encounter for general adult medical examination without abnormal findings: Secondary | ICD-10-CM | POA: Diagnosis not present

## 2017-11-28 MED ORDER — LISINOPRIL 20 MG PO TABS: 20.0000 mg | ORAL_TABLET | Freq: Every day | ORAL | 3 refills | Status: DC

## 2017-11-28 MED ORDER — ATORVASTATIN CALCIUM 40 MG PO TABS: 40.0000 mg | ORAL_TABLET | Freq: Every day | ORAL | 3 refills | Status: DC

## 2017-11-28 NOTE — Patient Instructions (Addendum)
Health Maintenance, Male A healthy lifestyle and preventive care is important for your health and wellness. Ask your health care provider about what schedule of regular examinations is right for you. What should I know about weight and diet? Eat a Healthy Diet  Eat plenty of vegetables, fruits, whole grains, low-fat dairy products, and lean protein.  Do not eat a lot of foods high in solid fats, added sugars, or salt.  Maintain a Healthy Weight Regular exercise can help you achieve or maintain a healthy weight. You should:  Do at least 150 minutes of exercise each week. The exercise should increase your heart rate and make you sweat (moderate-intensity exercise).  Do strength-training exercises at least twice a week.  Watch Your Levels of Cholesterol and Blood Lipids  Have your blood tested for lipids and cholesterol every 5 years starting at 53 years of age. If you are at high risk for heart disease, you should start having your blood tested when you are 53 years old. You may need to have your cholesterol levels checked more often if: ? Your lipid or cholesterol levels are high. ? You are older than 53 years of age. ? You are at high risk for heart disease.  What should I know about cancer screening? Many types of cancers can be detected early and may often be prevented. Lung Cancer  You should be screened every year for lung cancer if: ? You are a current smoker who has smoked for at least 30 years. ? You are a former smoker who has quit within the past 15 years.  Talk to your health care provider about your screening options, when you should start screening, and how often you should be screened.  Colorectal Cancer  Routine colorectal cancer screening usually begins at 53 years of age and should be repeated every 5-10 years until you are 53 years old. You may need to be screened more often if early forms of precancerous polyps or small growths are found. Your health care provider  may recommend screening at an earlier age if you have risk factors for colon cancer.  Your health care provider may recommend using home test kits to check for hidden blood in the stool.  A small camera at the end of a tube can be used to examine your colon (sigmoidoscopy or colonoscopy). This checks for the earliest forms of colorectal cancer.  Prostate and Testicular Cancer  Depending on your age and overall health, your health care provider may do certain tests to screen for prostate and testicular cancer.  Talk to your health care provider about any symptoms or concerns you have about testicular or prostate cancer.  Skin Cancer  Check your skin from head to toe regularly.  Tell your health care provider about any new moles or changes in moles, especially if: ? There is a change in a mole's size, shape, or color. ? You have a mole that is larger than a pencil eraser.  Always use sunscreen. Apply sunscreen liberally and repeat throughout the day.  Protect yourself by wearing long sleeves, pants, a wide-brimmed hat, and sunglasses when outside.  What should I know about heart disease, diabetes, and high blood pressure?  If you are 18-39 years of age, have your blood pressure checked every 3-5 years. If you are 40 years of age or older, have your blood pressure checked every year. You should have your blood pressure measured twice-once when you are at a hospital or clinic, and once when   you are not at a hospital or clinic. Record the average of the two measurements. To check your blood pressure when you are not at a hospital or clinic, you can use: ? An automated blood pressure machine at a pharmacy. ? A home blood pressure monitor.  Talk to your health care provider about your target blood pressure.  If you are between 58-22 years old, ask your health care provider if you should take aspirin to prevent heart disease.  Have regular diabetes screenings by checking your fasting blood  sugar level. ? If you are at a normal weight and have a low risk for diabetes, have this test once every three years after the age of 53. ? If you are overweight and have a high risk for diabetes, consider being tested at a younger age or more often.  A one-time screening for abdominal aortic aneurysm (AAA) by ultrasound is recommended for men aged 40-75 years who are current or former smokers. What should I know about preventing infection? Hepatitis B If you have a higher risk for hepatitis B, you should be screened for this virus. Talk with your health care provider to find out if you are at risk for hepatitis B infection. Hepatitis C Blood testing is recommended for:  Everyone born from [birth year] through 1966.  Anyone with known risk factors for hepatitis C.  Sexually Transmitted Diseases (STDs)  You should be screened each year for STDs including gonorrhea and chlamydia if: ? You are sexually active and are younger than 53 years of age. ? You are older than 53 years of age and your health care provider tells you that you are at risk for this type of infection. ? Your sexual activity has changed since you were last screened and you are at an increased risk for chlamydia or gonorrhea. Ask your health care provider if you are at risk.  Talk with your health care provider about whether you are at high risk of being infected with HIV. Your health care provider may recommend a prescription medicine to help prevent HIV infection.  What else can I do?  Schedule regular health, dental, and eye exams.  Stay current with your vaccines (immunizations).  Do not use any tobacco products, such as cigarettes, chewing tobacco, and e-cigarettes. If you need help quitting, ask your health care provider.  Limit alcohol intake to no more than 2 drinks per day. One drink equals 12 ounces of beer, 5 ounces of wine, or 1 ounces of hard liquor.  Do not use street drugs.  Do not share needles.  Ask your  health care provider for help if you need support or information about quitting drugs.  Tell your health care provider if you often feel depressed.  Tell your health care provider if you have ever been abused or do not feel safe at home. This information is not intended to replace advice given to you by your health care provider. Make sure you discuss any questions you have with your health care provider. Document Released: 09/07/2007 Document Revised: 11/08/2015 Document Reviewed: 12/13/2014 Elsevier Interactive Patient Education  [employer].   If you have lab work done today you will be contacted with your lab results within the next 2 weeks.  If you have not heard from [place] then please contact us. The fastest way to get your results is to register for My Chart.   IF you received an x-ray today, you will receive an invoice from [facility]. Please contact  [facility] at [phone] with questions or concerns regarding your invoice.   IF you received labwork today, you will receive an invoice from [facility]. Please contact [facility] at [phone] with questions or concerns regarding your invoice.   Our billing staff will not be able to assist you with questions regarding bills from these companies.  You will be contacted with the lab results as soon as they are available. The fastest way to get your results is to activate your My Chart account. Instructions are located on the last page of this paperwork. If you have not heard from [place] regarding the results in 2 weeks, please contact this office.

## 2017-11-28 NOTE — Progress Notes (Signed)
MRN: [id number]  Subjective:   [name] is a 52 y.o. male presenting for annual physical exam. Patient is single, drives truck. Denies smoking cigarettes or drinking alcohol.  Patient is requesting medication refills.  Medical care team includes: PCP: [name], PA-C Vision: Wears glasses. Dental: Gets regular dental cleaning. Had a root canal last year. Specialists: None.   Bryce Bush has a current medication list which includes the following prescription(s): aspirin, atorvastatin, cetirizine, fish oil-omega-3 fatty acids, lisinopril, and lysine. He has No Known Allergies.  Bryce Bush  has a past medical history of Hypercholesteremia and Hypertension. Also  has a past surgical history that includes Appendectomy; Tonsillectomy; and Spine surgery. His family history includes Alcohol abuse in his brother; COPD in his brother; Cancer (age of onset: 64) in his father; Bryce Bush' disease in his brother; Heart disease (age of onset: 20) in his father; Hyperlipidemia in his mother; Hypertension in his mother.  Immunizations: Up to date. Flu shot updated today.  Review of Systems  Constitutional: Negative for chills, diaphoresis, fever, malaise/fatigue and weight loss.  HENT: Negative for congestion, ear discharge, ear pain, hearing loss, nosebleeds, sore throat and tinnitus.   Eyes: Negative for blurred vision, double vision, photophobia, pain, discharge and redness.  Respiratory: Negative for cough, shortness of breath and wheezing.   Cardiovascular: Negative for chest pain, palpitations and leg swelling.  Gastrointestinal: Negative for abdominal pain, blood in stool, constipation, diarrhea, nausea and vomiting.  Genitourinary: Negative for dysuria, flank pain, frequency, hematuria and urgency.  Musculoskeletal: Negative for back pain, joint pain and myalgias.  Skin: Negative for itching and rash.  Neurological: Negative for dizziness, tingling, seizures, loss of consciousness, weakness and  headaches.  Endo/Heme/Allergies: Negative for polydipsia.  Psychiatric/Behavioral: Negative for depression, hallucinations, memory loss, substance abuse and suicidal ideas. The patient is not nervous/anxious and does not have insomnia.    Objective:   Vitals: BP (!) 147/90   Pulse 71   Temp 98 F (36.7 C) (Oral)   Resp 18   Ht 5' 8.5" (1.74 m)   Wt 221 lb 3.2 oz (100.3 kg)   SpO2 94%   BMI 33.14 kg/m   BP Readings from Last 3 Encounters:  11/28/17 (!) 147/90  08/13/17 (!) 149/79  12/26/16 121/83    Physical Exam  Constitutional: He is oriented to person, place, and time. He appears well-developed and well-nourished.  HENT:  TM's intact bilaterally, no effusions or erythema. Nasal turbinates pink and moist, nasal passages patent. Oropharynx clear, mucous membranes moist.  Eyes: Pupils are equal, round, and reactive to light. Conjunctivae and EOM are normal. Right eye exhibits no discharge. Left eye exhibits no discharge. No scleral icterus.  Neck: Normal range of motion. Neck supple. No thyromegaly present.  Cardiovascular: Normal rate, regular rhythm and intact distal pulses. Exam reveals no gallop and no friction rub.  No murmur heard. Pulmonary/Chest: No stridor. No respiratory distress. He has no wheezes. He has no rales.  Abdominal: Soft. Bowel sounds are normal. He exhibits no distension and no mass. There is no tenderness. There is no rebound and no guarding.  Musculoskeletal: Normal range of motion. He exhibits no edema or tenderness.  Lymphadenopathy:    He has no cervical adenopathy.  Neurological: He is alert and oriented to person, place, and time. He has normal reflexes.  Skin: Skin is warm and dry. No rash noted. No erythema. No pallor.  Psychiatric: He has a normal mood and affect.   Assessment and Plan :   Annual  physical exam  Mixed hyperlipidemia - Plan: Comprehensive metabolic panel, Lipid panel  Essential hypertension - Plan: TSH, Comprehensive  metabolic panel, Lipid panel, lisinopril (PRINIVIL,ZESTRIL) 20 MG tablet  Screening for deficiency anemia - Plan: CBC  Screening for thyroid disorder - Plan: TSH  Screening for diabetes mellitus  Class 1 obesity due to excess calories without serious comorbidity with body mass index (BMI) of 33.0 to 33.9 in adult - Plan: Hemoglobin A1c  Need for influenza vaccination - Plan: Flu Vaccine QUAD 6+ mos PF IM (Fluarix Quad PF)  Patient is medically stable, labs pending.  Medications refilled.  Consult on lifestyle modifications for obesity, hyperlipidemia and high blood pressure.  Discussed healthy lifestyle, diet, exercise, preventative care, vaccinations, and addressed patient's concerns.     [name], PA-C [facility] [phone] 11/28/2017  10:51 AM

## 2017-11-29 LAB — COMPREHENSIVE METABOLIC PANEL
A/G RATIO: 2 (ref 1.2–2.2)
ALBUMIN: 4.6 g/dL (ref 3.5–5.5)
ALK PHOS: 69 IU/L (ref 39–117)
ALT: 19 IU/L (ref 0–44)
AST: 17 IU/L (ref 0–40)
BUN/Creatinine Ratio: 17 (ref 9–20)
BUN: 16 mg/dL (ref 6–24)
Bilirubin Total: 0.5 mg/dL (ref 0.0–1.2)
CHLORIDE: 100 mmol/L (ref 96–106)
CO2: 24 mmol/L (ref 20–29)
Calcium: 9.6 mg/dL (ref 8.7–10.2)
Creatinine, Ser: 0.95 mg/dL (ref 0.76–1.27)
GFR calc non Af Amer: 91 mL/min/{1.73_m2} (ref >59)
GFR, EST AFRICAN AMERICAN: 105 mL/min/{1.73_m2} (ref >59)
GLOBULIN, TOTAL: 2.3 g/dL (ref 1.5–4.5)
GLUCOSE: 91 mg/dL (ref 65–99)
POTASSIUM: 4.3 mmol/L (ref 3.5–5.2)
SODIUM: 140 mmol/L (ref 134–144)
TOTAL PROTEIN: 6.9 g/dL (ref 6.0–8.5)

## 2017-11-29 LAB — CBC
HEMOGLOBIN: 15.6 g/dL (ref 13.0–17.7)
Hematocrit: 45.8 % (ref 37.5–51.0)
MCH: 30.8 pg (ref 26.6–33.0)
MCHC: 34.1 g/dL (ref 31.5–35.7)
MCV: 90 fL (ref 79–97)
Platelets: 261 10*3/uL (ref 150–450)
RBC: 5.07 x10E6/uL (ref 4.14–5.80)
RDW: 14.4 % (ref 12.3–15.4)
WBC: 6.5 10*3/uL (ref 3.4–10.8)

## 2017-11-29 LAB — LIPID PANEL
CHOLESTEROL TOTAL: 179 mg/dL (ref 100–199)
Chol/HDL Ratio: 3.7 ratio (ref 0.0–5.0)
HDL: 48 mg/dL (ref >39)
LDL Calculated: 100 mg/dL — ABNORMAL HIGH (ref 0–99)
Triglycerides: 155 mg/dL — ABNORMAL HIGH (ref 0–149)
VLDL Cholesterol Cal: 31 mg/dL (ref 5–40)

## 2017-11-29 LAB — TSH: TSH: 2.1 u[IU]/mL (ref 0.450–4.500)

## 2017-11-29 LAB — HEMOGLOBIN A1C
ESTIMATED AVERAGE GLUCOSE: 120 mg/dL
Hgb A1c MFr Bld: 5.8 % — ABNORMAL HIGH (ref 4.8–5.6)

## 2017-12-15 ENCOUNTER — Encounter: Payer: Self-pay | Admitting: Family Medicine

## 2017-12-15 ENCOUNTER — Other Ambulatory Visit: Payer: Self-pay

## 2017-12-15 ENCOUNTER — Ambulatory Visit: Payer: Self-pay | Admitting: Family Medicine

## 2017-12-15 VITALS — BP 138/82 | HR 72 | Temp 98.7°F | Resp 16 | Ht 67.72 in | Wt 222.0 lb

## 2017-12-15 DIAGNOSIS — Z024 Encounter for examination for driving license: Secondary | ICD-10-CM

## 2017-12-15 NOTE — Patient Instructions (Addendum)
   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  Please follow-up with primary care provider to discuss the blood that was noted in the urine, and possible further testing.   If you have lab work done today you will be contacted with your lab results within the next 2 weeks.  If you have not heard from [place] then please contact us. The fastest way to get your results is to register for My Chart.   IF you received an x-ray today, you will receive an invoice from [facility]. Please contact [facility] at [phone] with questions or concerns regarding your invoice.   IF you received labwork today, you will receive an invoice from [facility]. Please contact [facility] at [phone] with questions or concerns regarding your invoice.   Our billing staff will not be able to assist you with questions regarding bills from these companies.  You will be contacted with the lab results as soon as they are available. The fastest way to get your results is to activate your My Chart account. Instructions are located on the last page of this paperwork. If you have not heard from [place] regarding the results in 2 weeks, please contact this office.
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
## 2019-01-22 ENCOUNTER — Ambulatory Visit (INDEPENDENT_AMBULATORY_CARE_PROVIDER_SITE_OTHER): Payer: 59 | Admitting: Family Medicine

## 2019-01-22 ENCOUNTER — Encounter: Payer: Self-pay | Admitting: Family Medicine

## 2019-01-22 ENCOUNTER — Other Ambulatory Visit: Payer: Self-pay

## 2019-01-22 VITALS — BP 140/83 | HR 100 | Temp 98.1°F | Resp 14 | Wt 213.8 lb

## 2019-01-22 DIAGNOSIS — R3121 Asymptomatic microscopic hematuria: Secondary | ICD-10-CM

## 2019-01-22 DIAGNOSIS — E785 Hyperlipidemia, unspecified: Secondary | ICD-10-CM

## 2019-01-22 DIAGNOSIS — R7303 Prediabetes: Secondary | ICD-10-CM

## 2019-01-22 DIAGNOSIS — Z0001 Encounter for general adult medical examination with abnormal findings: Secondary | ICD-10-CM | POA: Diagnosis not present

## 2019-01-22 DIAGNOSIS — R109 Unspecified abdominal pain: Secondary | ICD-10-CM

## 2019-01-22 DIAGNOSIS — Z23 Encounter for immunization: Secondary | ICD-10-CM

## 2019-01-22 DIAGNOSIS — Z125 Encounter for screening for malignant neoplasm of prostate: Secondary | ICD-10-CM

## 2019-01-22 DIAGNOSIS — I10 Essential (primary) hypertension: Secondary | ICD-10-CM

## 2019-01-22 DIAGNOSIS — Z0000 Encounter for general adult medical examination without abnormal findings: Secondary | ICD-10-CM

## 2019-01-22 LAB — POCT URINALYSIS DIP (MANUAL ENTRY)
Bilirubin, UA: NEGATIVE
Glucose, UA: NEGATIVE mg/dL
Ketones, POC UA: NEGATIVE mg/dL
Leukocytes, UA: NEGATIVE
Nitrite, UA: NEGATIVE
Protein Ur, POC: NEGATIVE mg/dL
Spec Grav, UA: 1.015 (ref 1.010–1.025)
Urobilinogen, UA: 0.2 E.U./dL
pH, UA: 5.5 (ref 5.0–8.0)

## 2019-01-22 MED ORDER — LISINOPRIL 20 MG PO TABS: 20.0000 mg | ORAL_TABLET | Freq: Every day | ORAL | 3 refills | Status: DC

## 2019-01-22 MED ORDER — ATORVASTATIN CALCIUM 40 MG PO TABS: 40.0000 mg | ORAL_TABLET | Freq: Every day | ORAL | 3 refills | Status: DC

## 2019-01-22 NOTE — Patient Instructions (Addendum)
Some form of exercise most days per week. Goal of at least 150 mins per week. That should help with prediabetes and blood pressure.   I will refer you to urology to discuss the blood in urine.  If any return of flank pain or abdominal pain, return for recheck.  Follow-up in 6 months to review your medications and labs.  Thank you for coming in today  Keeping you healthy  Get these tests  Blood pressure- Have your blood pressure checked once a year by your healthcare provider.  Normal blood pressure is 120/80  Weight- Have your body mass index (BMI) calculated to screen for obesity.  BMI is a measure of body fat based on height and weight. You can also calculate your own BMI at ViewBanking.si.  Cholesterol- Have your cholesterol checked every year.  Diabetes- Have your blood sugar checked regularly if you have high blood pressure, high cholesterol, have a family history of diabetes or if you are overweight.  Screening for Colon Cancer- Colonoscopy starting at age 3.  Screening may begin sooner depending on your family history and other health conditions. Follow up colonoscopy as directed by your Gastroenterologist.  Screening for Prostate Cancer- Both blood work (PSA) and a rectal exam help screen for Prostate Cancer.  Screening begins at age 59 with African-American men and at age 5 with Caucasian men.  Screening may begin sooner depending on your family history.  Take these medicines  Aspirin- One aspirin daily can help prevent Heart disease and Stroke.  Flu shot- Every fall.  Tetanus- Every 10 years.  Zostavax- Once after the age of 40 to prevent Shingles.  Pneumonia shot- Once after the age of 61; if you are younger than 48, ask your healthcare provider if you need a Pneumonia shot.  Take these steps  Don't smoke- If you do smoke, talk to your doctor about quitting.  For tips on how to quit, go to www.smokefree.gov or call 1-800-QUIT-NOW.  Be physically active-  Exercise 5 days a week for at least 30 minutes.  If you are not already physically active start slow and gradually work up to 30 minutes of moderate physical activity.  Examples of moderate activity include walking briskly, mowing the yard, dancing, swimming, bicycling, etc.  Eat a healthy diet- Eat a variety of healthy food such as fruits, vegetables, low fat milk, low fat cheese, yogurt, lean meant, poultry, fish, beans, tofu, etc. For more information go to www.thenutritionsource.org  Drink alcohol in moderation- Limit alcohol intake to less than two drinks a day. Never drink and drive.  Dentist- Brush and floss twice daily; visit your dentist twice a year.  Depression- Your emotional health is as important as your physical health. If you're feeling down, or losing interest in things you would normally enjoy please talk to your healthcare provider.  Eye exam- Visit your eye doctor every year.  Safe sex- If you may be exposed to a sexually transmitted infection, use a condom.  Seat belts- Seat belts can save your life; always wear one.  Smoke/Carbon Monoxide detectors- These detectors need to be installed on the appropriate level of your home.  Replace batteries at least once a year.  Skin cancer- When out in the sun, cover up and use sunscreen 15 SPF or higher.  Violence- If anyone is threatening you, please tell your healthcare provider.  Living Will/ Health care power of attorney- Speak with your healthcare provider and family.    If you have lab work done  today you will be contacted with your lab results within the next 2 weeks.  If you have not heard from [place] then please contact us. The fastest way to get your results is to register for My Chart.   IF you received an x-ray today, you will receive an invoice from [facility]. Please contact [facility] Radiology at [phone] with questions or concerns regarding your invoice.   IF you received labwork today, you will  receive an invoice from [place]. Please contact [facility] at [phone] with questions or concerns regarding your invoice.   Our billing staff will not be able to assist you with questions regarding bills from these companies.  You will be contacted with the lab results as soon as they are available. The fastest way to get your results is to activate your My Chart account. Instructions are located on the last page of this paperwork. If you have not heard from [place] regarding the results in 2 weeks, please contact this office.

## 2019-01-22 NOTE — Progress Notes (Signed)
Subjective:    Patient ID: Bryce Bush., male    DOB: 01-04-1966, 53 y.o.   MRN: [id number]  HPI Bryce Bush. is a 53 y.o. male Presents today for: Chief Complaint  Patient presents with  . Annual Exam    annual exam last visit had trace in of blood ion urine and not to sure if he want to recheck this. Little pain in flank area.   Last physical of [name] in September 2019.  Prediabetes:  Wt Readings from Last 3 Encounters:  01/22/19 213 lb 12.8 oz (97 kg)  12/15/17 222 lb (100.7 kg)  11/28/17 221 lb 3.2 oz (100.3 kg)  Body mass index is 32.78 kg/m. Lab Results  Component Value Date   HGBA1C 5.8 (H) 11/28/2017  local truck driver, no regular exercise.  Keto diet.   Hyperlipidemia:  Lab Results  Component Value Date   CHOL 179 11/28/2017   HDL 48 11/28/2017   LDLCALC 100 (H) 11/28/2017   TRIG 155 (H) 11/28/2017   CHOLHDL 3.7 11/28/2017   Lab Results  Component Value Date   ALT 19 11/28/2017   AST 17 11/28/2017   ALKPHOS 69 11/28/2017   BILITOT 0.5 11/28/2017  lipitor 40mg  qd.  No new myalgias/side effects.    Hypertension: BP Readings from Last 3 Encounters:  01/22/19 140/83  12/15/17 138/82  11/28/17 (!) 147/90   Lab Results  Component Value Date   CREATININE 0.95 11/28/2017  lisinopril 20mg  qd.  On ASA 81mg  qd.  Home readings: 136/73. No med side effects.    Hematuria: Noted on screening urinalysis December 15, 2017 at DOT physical.  Advised to follow-up with primary care provider for repeat testing at that time.  Has not had repeat testing or evaluation since.  No visible blood in urine.  Occasional side pain, none currently, no known  Hx of stone.   Cancer screening: Colonoscopy 01/18/15 Prostate - PSA 0.7 in 2018. DRE declined, agreed on PSA after R/B discussion.   Immunization History  Administered Date(s) Administered  . Influenza,inj,Quad PF,6+ Mos 12/22/2013, 11/22/2015, 11/27/2016, 11/28/2017, 01/22/2019  . Tdap 11/02/2014      Depression screen [facility] 2/9 01/22/2019 12/15/2017 08/13/2017 12/18/2016 11/27/2016  Decreased Interest 0 0 0 0 0  Down, Depressed, Hopeless 0 0 0 0 0  PHQ - 2 Score 0 0 0 0 0    Hearing Screening   125Hz  250Hz  500Hz  1000Hz  2000Hz  3000Hz  4000Hz  6000Hz  8000Hz   Right ear:           Left ear:             Visual Acuity Screening   Right eye Left eye Both eyes  Without correction:     With correction: 20/20 20/15 20/15     Dental: Dr. Nicki Reaper - now every 6 months. Recent deep scaling.   Exercise: as above   Review of Systems 13 point review of systems per patient health survey noted.  Negative other than as indicated above or in HPI.      Objective:   Physical Exam Vitals signs reviewed.  Constitutional:      Appearance: He is well-developed.  HENT:     Head: Normocephalic and atraumatic.     Right Ear: External ear normal.     Left Ear: External ear normal.  Eyes:     Conjunctiva/sclera: Conjunctivae normal.     Pupils: Pupils are equal, round, and reactive to light.  Neck:     Musculoskeletal: Normal range of  motion and neck supple.     Thyroid: No thyromegaly.  Cardiovascular:     Rate and Rhythm: Normal rate and regular rhythm.     Heart sounds: Normal heart sounds.  Pulmonary:     Effort: Pulmonary effort is normal. No respiratory distress.     Breath sounds: Normal breath sounds. No wheezing.  Abdominal:     General: There is no distension.     Palpations: Abdomen is soft.     Tenderness: There is no abdominal tenderness.  Musculoskeletal: Normal range of motion.        General: No tenderness.  Lymphadenopathy:     Cervical: No cervical adenopathy.  Skin:    General: Skin is warm and dry.  Neurological:     Mental Status: He is alert and oriented to person, place, and time.     Deep Tendon Reflexes: Reflexes are normal and symmetric.  Psychiatric:        Behavior: Behavior normal.    Vitals:   01/22/19 0921 01/22/19 0931  BP: 138/90 140/83  Pulse: 100    Resp: 14   Temp: 98.1 F (36.7 C)   TempSrc: Oral   SpO2: 98%   Weight: 213 lb 12.8 oz (97 kg)    Results for orders placed or performed in visit on 01/22/19  POCT urinalysis dipstick  Result Value Ref Range   Color, UA yellow yellow   Clarity, UA clear clear   Glucose, UA negative negative mg/dL   Bilirubin, UA negative negative   Ketones, POC UA negative negative mg/dL   Spec Grav, UA 1.015 1.010 - 1.025   Blood, UA moderate (A) negative   pH, UA 5.5 5.0 - 8.0   Protein Ur, POC negative negative mg/dL   Urobilinogen, UA 0.2 0.2 or 1.0 E.U./dL   Nitrite, UA Negative Negative   Leukocytes, UA Negative Negative       Assessment & Plan:  Bryce Bush. is a 53 y.o. male Annual physical exam  - -anticipatory guidance as below in AVS, screening labs above. Health maintenance items as above in HPI discussed/recommended as applicable.   Need for prophylactic vaccination and inoculation against influenza - Plan: Flu Vaccine QUAD 6+ mos PF IM (Fluarix Quad PF)  Flank pain, acute - Plan: POCT urinalysis dipstick, Ambulatory referral to Urology Asymptomatic microscopic hematuria - Plan: Ambulatory referral to Urology, Urinalysis, microscopic only  -Episodic flank pain, asymptomatic currently.  Differential includes nephrolithiasis.  Will refer to urology for further evaluation, check micro from urinalysis today.  Prediabetes - Plan: Hemoglobin A1c  -Commended on diet changes, but increased exercise recommended.  Repeat HbA1c, follow-up in 6 months  Hyperlipidemia, unspecified hyperlipidemia type - Plan: Lipid Panel  -Tolerating Lipitor, continue same.  Labs pending  Essential hypertension - Plan: Comprehensive metabolic panel  -Stable, continue lisinopril same dose.  Screening for malignant neoplasm of prostate - Plan: PSA  - We discussed pros and cons of prostate cancer screening, and after this discussion, he chose to have screening done with PSA only, limitations and partial  testing discussed.  No orders of the defined types were placed in this encounter.  Patient Instructions    Some form of exercise most days per week. Goal of at least 150 mins per week. That should help with prediabetes and blood pressure.   I will refer you to urology to discuss the blood in urine.  If any return of flank pain or abdominal pain, return for recheck.  Follow-up in 6 months  to review your medications and labs.  Thank you for coming in today  Keeping you healthy  Get these tests  Blood pressure- Have your blood pressure checked once a year by your healthcare provider.  Normal blood pressure is 120/80  Weight- Have your body mass index (BMI) calculated to screen for obesity.  BMI is a measure of body fat based on height and weight. You can also calculate your own BMI at ViewBanking.si.  Cholesterol- Have your cholesterol checked every year.  Diabetes- Have your blood sugar checked regularly if you have high blood pressure, high cholesterol, have a family history of diabetes or if you are overweight.  Screening for Colon Cancer- Colonoscopy starting at age 62.  Screening may begin sooner depending on your family history and other health conditions. Follow up colonoscopy as directed by your Gastroenterologist.  Screening for Prostate Cancer- Both blood work (PSA) and a rectal exam help screen for Prostate Cancer.  Screening begins at age 61 with African-American men and at age 58 with Caucasian men.  Screening may begin sooner depending on your family history.  Take these medicines  Aspirin- One aspirin daily can help prevent Heart disease and Stroke.  Flu shot- Every fall.  Tetanus- Every 10 years.  Zostavax- Once after the age of 54 to prevent Shingles.  Pneumonia shot- Once after the age of 69; if you are younger than 74, ask your healthcare provider if you need a Pneumonia shot.  Take these steps  Don't smoke- If you do smoke, talk to your doctor  about quitting.  For tips on how to quit, go to www.smokefree.gov or call 1-800-QUIT-NOW.  Be physically active- Exercise 5 days a week for at least 30 minutes.  If you are not already physically active start slow and gradually work up to 30 minutes of moderate physical activity.  Examples of moderate activity include walking briskly, mowing the yard, dancing, swimming, bicycling, etc.  Eat a healthy diet- Eat a variety of healthy food such as fruits, vegetables, low fat milk, low fat cheese, yogurt, lean meant, poultry, fish, beans, tofu, etc. For more information go to www.thenutritionsource.org  Drink alcohol in moderation- Limit alcohol intake to less than two drinks a day. Never drink and drive.  Dentist- Brush and floss twice daily; visit your dentist twice a year.  Depression- Your emotional health is as important as your physical health. If you're feeling down, or losing interest in things you would normally enjoy please talk to your healthcare provider.  Eye exam- Visit your eye doctor every year.  Safe sex- If you may be exposed to a sexually transmitted infection, use a condom.  Seat belts- Seat belts can save your life; always wear one.  Smoke/Carbon Monoxide detectors- These detectors need to be installed on the appropriate level of your home.  Replace batteries at least once a year.  Skin cancer- When out in the sun, cover up and use sunscreen 15 SPF or higher.  Violence- If anyone is threatening you, please tell your healthcare provider.  Living Will/ Health care power of attorney- Speak with your healthcare provider and family.    If you have lab work done today you will be contacted with your lab results within the next 2 weeks.  If you have not heard from [place] then please contact us. The fastest way to get your results is to register for My Chart.   IF you received an x-ray today, you will receive an invoice from [facility]. Please  contact [facility]iology  at [phone] with questions or concerns regarding your invoice.   IF you received labwork today, you will receive an invoice from [place]. Please contact [facility] at [phone] with questions or concerns regarding your invoice.   Our billing staff will not be able to assist you with questions regarding bills from these companies.  You will be contacted with the lab results as soon as they are available. The fastest way to get your results is to activate your My Chart account. Instructions are located on the last page of this paperwork. If you have not heard from [place] regarding the results in 2 weeks, please contact this office.       Signed,   [name], MD Primary Care at [place].  01/22/19 12:31 PM

## 2019-01-23 LAB — COMPREHENSIVE METABOLIC PANEL
ALT: 14 IU/L (ref 0–44)
AST: 16 IU/L (ref 0–40)
Albumin/Globulin Ratio: 1.8 (ref 1.2–2.2)
Albumin: 4.4 g/dL (ref 3.8–4.9)
Alkaline Phosphatase: 59 IU/L (ref 39–117)
BUN/Creatinine Ratio: 15 (ref 9–20)
BUN: 13 mg/dL (ref 6–24)
Bilirubin Total: 0.4 mg/dL (ref 0.0–1.2)
CO2: 24 mmol/L (ref 20–29)
Calcium: 9.7 mg/dL (ref 8.7–10.2)
Chloride: 102 mmol/L (ref 96–106)
Creatinine, Ser: 0.85 mg/dL (ref 0.76–1.27)
GFR calc Af Amer: 114 mL/min/{1.73_m2} (ref >59)
GFR calc non Af Amer: 99 mL/min/{1.73_m2} (ref >59)
Globulin, Total: 2.5 g/dL (ref 1.5–4.5)
Glucose: 95 mg/dL (ref 65–99)
Potassium: 4.5 mmol/L (ref 3.5–5.2)
Sodium: 138 mmol/L (ref 134–144)
Total Protein: 6.9 g/dL (ref 6.0–8.5)

## 2019-01-23 LAB — URINALYSIS, MICROSCOPIC ONLY
Bacteria, UA: NONE SEEN
Casts: NONE SEEN /lpf

## 2019-01-23 LAB — LIPID PANEL
Chol/HDL Ratio: 3.7 ratio (ref 0.0–5.0)
Cholesterol, Total: 180 mg/dL (ref 100–199)
HDL: 49 mg/dL (ref >39)
LDL Chol Calc (NIH): 113 mg/dL — ABNORMAL HIGH (ref 0–99)
Triglycerides: 96 mg/dL (ref 0–149)
VLDL Cholesterol Cal: 18 mg/dL (ref 5–40)

## 2019-01-23 LAB — HEMOGLOBIN A1C
Est. average glucose Bld gHb Est-mCnc: 114 mg/dL
Hgb A1c MFr Bld: 5.6 % (ref 4.8–5.6)

## 2019-01-23 LAB — PSA: Prostate Specific Ag, Serum: 0.8 ng/mL (ref 0.0–4.0)

## 2019-11-18 ENCOUNTER — Encounter: Payer: Self-pay | Admitting: Family Medicine

## 2019-11-18 ENCOUNTER — Other Ambulatory Visit: Payer: Self-pay

## 2019-11-18 ENCOUNTER — Ambulatory Visit (INDEPENDENT_AMBULATORY_CARE_PROVIDER_SITE_OTHER): Payer: 59 | Admitting: Family Medicine

## 2019-11-18 VITALS — BP 143/90 | HR 62 | Temp 98.1°F | Ht 67.5 in | Wt 216.8 lb

## 2019-11-18 DIAGNOSIS — Z0000 Encounter for general adult medical examination without abnormal findings: Secondary | ICD-10-CM

## 2019-11-18 DIAGNOSIS — E663 Overweight: Secondary | ICD-10-CM

## 2019-11-18 DIAGNOSIS — E782 Mixed hyperlipidemia: Secondary | ICD-10-CM | POA: Diagnosis not present

## 2019-11-18 DIAGNOSIS — Z23 Encounter for immunization: Secondary | ICD-10-CM | POA: Diagnosis not present

## 2019-11-18 DIAGNOSIS — Z0001 Encounter for general adult medical examination with abnormal findings: Secondary | ICD-10-CM

## 2019-11-18 DIAGNOSIS — I10 Essential (primary) hypertension: Secondary | ICD-10-CM

## 2019-11-18 MED ORDER — ATORVASTATIN CALCIUM 40 MG PO TABS: 40.0000 mg | ORAL_TABLET | Freq: Every day | ORAL | 3 refills | Status: DC

## 2019-11-18 MED ORDER — LISINOPRIL 20 MG PO TABS: 20.0000 mg | ORAL_TABLET | Freq: Every day | ORAL | 3 refills | Status: DC

## 2019-11-18 NOTE — Patient Instructions (Addendum)
  Flu shot today  Please reconsider and get the COVID-19 vaccine.  The risks are extremely minimal and the benefit is huge.  Continue your blood pressure and cholesterol medications.  We will let you know the results of your laboratory testing sometime next week.  Continue watching your diet by eliminating the snacks.  Get regular exercise, especially some cardio type activity shooting for at least 150 minutes a week.  I urged you to seek to be physically, emotionally, relationally, and spiritually healthy.  Return annually or if problems arise.   If you have lab work done today you will be contacted with your lab results within the next 2 weeks.  If you have not heard from [place] then please contact us. The fastest way to get your results is to register for My Chart.   IF you received an x-ray today, you will receive an invoice from [facility]. Please contact [facility] Radiology at [phone] with questions or concerns regarding your invoice.   IF you received labwork today, you will receive an invoice from [place]. Please contact [facility] at [phone] with questions or concerns regarding your invoice.   Our billing staff will not be able to assist you with questions regarding bills from these companies.  You will be contacted with the lab results as soon as they are available. The fastest way to get your results is to activate your My Chart account. Instructions are located on the last page of this paperwork. If you have not heard from [place] regarding the results in 2 weeks, please contact this office.
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
## 2019-11-18 NOTE — Addendum Note (Signed)
Addended by: Teresita Madura on: 4/58/0998 02:39 PM   Modules accepted: Orders

## 2019-11-19 LAB — CMP14+EGFR
ALT: 13 IU/L (ref 0–44)
AST: 14 IU/L (ref 0–40)
Albumin/Globulin Ratio: 2 (ref 1.2–2.2)
Albumin: 4.8 g/dL (ref 3.8–4.9)
Alkaline Phosphatase: 54 IU/L (ref 48–121)
BUN/Creatinine Ratio: 19 (ref 9–20)
BUN: 16 mg/dL (ref 6–24)
Bilirubin Total: 0.6 mg/dL (ref 0.0–1.2)
CO2: 24 mmol/L (ref 20–29)
Calcium: 9.5 mg/dL (ref 8.7–10.2)
Chloride: 104 mmol/L (ref 96–106)
Creatinine, Ser: 0.84 mg/dL (ref 0.76–1.27)
GFR calc Af Amer: 114 mL/min/{1.73_m2} (ref >59)
GFR calc non Af Amer: 99 mL/min/{1.73_m2} (ref >59)
Globulin, Total: 2.4 g/dL (ref 1.5–4.5)
Glucose: 89 mg/dL (ref 65–99)
Potassium: 4.5 mmol/L (ref 3.5–5.2)
Sodium: 141 mmol/L (ref 134–144)
Total Protein: 7.2 g/dL (ref 6.0–8.5)

## 2019-11-19 LAB — LIPID PANEL
Chol/HDL Ratio: 2.9 ratio (ref 0.0–5.0)
Cholesterol, Total: 143 mg/dL (ref 100–199)
HDL: 50 mg/dL (ref >39)
LDL Chol Calc (NIH): 75 mg/dL (ref 0–99)
Triglycerides: 100 mg/dL (ref 0–149)
VLDL Cholesterol Cal: 18 mg/dL (ref 5–40)

## 2019-11-19 LAB — HEMOGLOBIN A1C
Est. average glucose Bld gHb Est-mCnc: 114 mg/dL
Hgb A1c MFr Bld: 5.6 % (ref 4.8–5.6)

## 2019-11-19 LAB — PSA: Prostate Specific Ag, Serum: 1.3 ng/mL (ref 0.0–4.0)

## 2019-11-19 NOTE — Progress Notes (Signed)
Call patient:Labs are all excellent.Fenton Malling. Linna Darner, MD

## 2020-05-04 ENCOUNTER — Encounter: Payer: Self-pay | Admitting: Gastroenterology

## 2020-10-04 ENCOUNTER — Encounter: Payer: Self-pay | Admitting: Family Medicine

## 2020-10-04 ENCOUNTER — Ambulatory Visit (INDEPENDENT_AMBULATORY_CARE_PROVIDER_SITE_OTHER): Payer: 59 | Admitting: Family Medicine

## 2020-10-04 ENCOUNTER — Other Ambulatory Visit: Payer: Self-pay

## 2020-10-04 VITALS — BP 142/80 | HR 82 | Temp 98.4°F | Resp 16 | Ht 67.5 in | Wt 229.6 lb

## 2020-10-04 DIAGNOSIS — Z1329 Encounter for screening for other suspected endocrine disorder: Secondary | ICD-10-CM | POA: Diagnosis not present

## 2020-10-04 DIAGNOSIS — I10 Essential (primary) hypertension: Secondary | ICD-10-CM | POA: Diagnosis not present

## 2020-10-04 DIAGNOSIS — Z0000 Encounter for general adult medical examination without abnormal findings: Secondary | ICD-10-CM

## 2020-10-04 DIAGNOSIS — E782 Mixed hyperlipidemia: Secondary | ICD-10-CM

## 2020-10-04 DIAGNOSIS — Z125 Encounter for screening for malignant neoplasm of prostate: Secondary | ICD-10-CM

## 2020-10-04 DIAGNOSIS — E785 Hyperlipidemia, unspecified: Secondary | ICD-10-CM

## 2020-10-04 DIAGNOSIS — K625 Hemorrhage of anus and rectum: Secondary | ICD-10-CM | POA: Diagnosis not present

## 2020-10-04 DIAGNOSIS — R7303 Prediabetes: Secondary | ICD-10-CM

## 2020-10-04 MED ORDER — ATORVASTATIN CALCIUM 40 MG PO TABS: 40.0000 mg | ORAL_TABLET | Freq: Every day | ORAL | 3 refills | Status: DC

## 2020-10-04 MED ORDER — LISINOPRIL 40 MG PO TABS: 40.0000 mg | ORAL_TABLET | Freq: Every day | ORAL | 1 refills | Status: DC

## 2020-10-04 NOTE — Progress Notes (Signed)
Subjective:  Patient ID: Bryce Bush., male    DOB: 08-26-1965  Age: 55 y.o. MRN: [id number]  CC:  Chief Complaint  Patient presents with   Annual Exam    Pt here today no concerns, pt is fasting today pt due for colon cancer screening would like to discuss options     HPI Bryce Bush. presents for   Annual exam:  Hypertension: Lisinopril 20mg  qd. No missed doses Home readings: 140/80 range.   BP Readings from Last 3 Encounters:  10/04/20 (!) 142/80  11/18/19 (!) 143/90  01/22/19 140/83   Lab Results  Component Value Date   CREATININE 0.84 11/18/2019    Hyperlipidemia: Lipitor 40mg  qd, no new myalgias/side effects.  Lab Results  Component Value Date   CHOL 143 11/18/2019   HDL 50 11/18/2019   LDLCALC 75 11/18/2019   TRIG 100 11/18/2019   CHOLHDL 2.9 11/18/2019   Lab Results  Component Value Date   ALT 13 11/18/2019   AST 14 11/18/2019   ALKPHOS 54 11/18/2019   BILITOT 0.6 11/18/2019   Obesity Fast food: 3 times per week.  No soda or sweet tea. Trying to avoid late night meals.  Alcohol - none. No tobacco.  No regular exercise. 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 Snacking throughout day.  Question about possible low testosterone. No new weakness, or fatigue. Declines testing.  No difficulty with erections.  A1c ok last year - 5.6. prediabetic with A1c 5.8 in 2019.  Body mass index is 35.43 kg/m. Wt Readings from Last 3 Encounters:  10/04/20 229 lb 9.6 oz (104.1 kg)  11/18/19 216 lb 12.8 oz (98.3 kg)  01/22/19 213 lb 12.8 oz (97 kg)   Depression screen [facility] 2/9 10/04/2020 11/18/2019 01/22/2019 12/15/2017 08/13/2017  Decreased Interest 0 0 0 0 0  Down, Depressed, Hopeless 0 1 0 0 0  PHQ - 2 Score 0 1 0 0 0   Cancer screening: Colon: colonoscopy in 2016, tubular adenoma. Blood in stool few months ago, not recent. Received letter to schedule but has not called.   Prostate: no FH of prostate CA. The natural history of  prostate cancer and ongoing controversy regarding screening and potential treatment outcomes of prostate cancer has been discussed with the patient. The meaning of a false positive PSA and a false negative PSA has been discussed. He indicates understanding of the limitations of this screening test and wishes to proceed with screening PSA testing. Lab Results  Component Value Date   PSA1 1.3 11/18/2019   PSA1 0.8 01/22/2019   PSA1 0.7 11/27/2016   PSA 0.5 11/22/2015    Immunization History  Administered Date(s) Administered   Influenza,inj,Quad PF,6+ Mos 12/22/2013, 11/22/2015, 11/27/2016, 11/28/2017, 01/22/2019, 11/18/2019   Tdap 11/02/2014  Covid vaccine x2 -[place]. Had one booster. Recommended 2nd.   Vision Screening   Right eye Left eye Both eyes  Without correction     With correction 20/13 20/13 20/10   Optho visit few months ago.   Dentist every 6 months.   No tobacco/alcohol   Exercise - min as above.     History Patient Active Problem List   Diagnosis Date Noted   Class 1 obesity due to excess calories with serious comorbidity and body mass index (BMI) of 32.0 to 32.9 in adult 12/22/2016   HTN (hypertension) 03/11/2012   Hyperlipidemia 03/11/2012   Past Medical History:  Diagnosis Date   Hypercholesteremia    Hypertension    Past  Surgical History:  Procedure Laterality Date   APPENDECTOMY     SPINE SURGERY     Cervical spine surgery   TONSILLECTOMY     No Known Allergies Prior to Admission medications   Medication Sig Start Date End Date Taking? Authorizing Provider  aspirin 81 MG chewable tablet Chew 81 mg by mouth daily.   Yes [provider]  atorvastatin (LIPITOR) 40 MG tablet Take 1 tablet (40 mg total) by mouth daily. 11/18/19  Yes Posey Boyer, MD  cetirizine (ZYRTEC) 10 MG tablet Take 1 tablet (10 mg total) by mouth daily. 08/13/17  Yes [name], PA-C  fish oil-omega-3 fatty acids 1000 MG capsule Take 1 g by mouth daily.   Yes  [provider]  lisinopril (ZESTRIL) 20 MG tablet Take 1 tablet (20 mg total) by mouth daily. 11/18/19  Yes Posey Boyer, MD  LYSINE PO Take 1 tablet by mouth daily.   Yes [provider]   Social History   Socioeconomic History   Marital status: Single    Spouse name: Not on file   Number of children: 2   Years of education: Not on file   Highest education level: Not on file  Occupational History   Occupation: truck driver    Comment: [employer]  Tobacco Use   Smoking status: Never   Smokeless tobacco: Never  Substance and Sexual Activity   Alcohol use: No   Drug use: No   Sexual activity: Yes  Other Topics Concern   Not on file  Social History Narrative   Marital status: Single; not dating      Children: 2 children/adopted.  No grandchildren.      Education: [employer].      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      Tobacco: none      Alcohol: none      Drugs: none      Exercise:  Weight room in house; lifts weights.      Seatbelt: 100%; no texting      Sexual activity: none in past year; no STDs.  Females only.           Social Determinants of Health   Financial Resource Strain: Not on file  Food Insecurity: Not on file  Transportation Needs: Not on file  Physical Activity: Not on file  Stress: Not on file  Social Connections: Not on file  Intimate Partner Violence: Not on file    Review of Systems 13 point review of systems per patient health survey noted.  Negative other than as indicated above or in HPI.   Objective:   Vitals:   10/04/20 1403  BP: (!) 142/80  Pulse: 82  Resp: 16  Temp: 98.4 F (36.9 C)  TempSrc: Temporal  SpO2: 97%  Weight: 229 lb 9.6 oz (104.1 kg)  Height: 5' 7.5" (1.715 m)     Physical Exam Vitals reviewed.  Constitutional:      Appearance: He is well-developed. He is obese.  HENT:     Head: Normocephalic and atraumatic.     Right Ear: External ear  normal.     Left Ear: External ear normal.  Eyes:     Conjunctiva/sclera: Conjunctivae normal.     Pupils: Pupils are equal, round, and reactive to light.  Neck:     Thyroid: No thyromegaly.  Cardiovascular:     Rate and Rhythm: Normal rate and regular  rhythm.     Heart sounds: Normal heart sounds.  Pulmonary:     Effort: Pulmonary effort is normal. No respiratory distress.     Breath sounds: Normal breath sounds. No wheezing.  Abdominal:     General: There is no distension.     Palpations: Abdomen is soft.     Tenderness: There is no abdominal tenderness.  Musculoskeletal:        General: No tenderness. Normal range of motion.     Cervical back: Normal range of motion and neck supple.  Lymphadenopathy:     Cervical: No cervical adenopathy.  Skin:    General: Skin is warm and dry.  Neurological:     Mental Status: He is alert and oriented to person, place, and time.     Deep Tendon Reflexes: Reflexes are normal and symmetric.  Psychiatric:        Behavior: Behavior normal.       Assessment & Plan:  Martavis Gurney. is a 55 y.o. male . Annual physical exam - Plan: Comprehensive metabolic panel, Lipid panel, PSA, Hemoglobin A1c  - -anticipatory guidance as below in AVS, screening labs above. Health maintenance items as above in HPI discussed/recommended as applicable.   Mixed hyperlipidemia - Plan: atorvastatin (LIPITOR) 40 MG tablet  -  Stable, tolerating current regimen. Medications refilled. Labs pending as above.   Essential hypertension - Plan: lisinopril (ZESTRIL) 40 MG tablet, CBC  -Decreased control, with some weight gain.  Anticipate improvements with weight loss, cholesterol medication, but for now we will try higher dose of lisinopril.  40 mg with orthostatic precautions, option of lower dose or add HCTZ but has some baseline urinary frequency.  RTC precautions.  Prediabetes - Plan: Comprehensive metabolic panel, Hemoglobin A1c  -Most recent A1c normal but with  placating will repeat A1c as history of prediabetes.  Hyperlipidemia, unspecified hyperlipidemia type - Plan: Comprehensive metabolic panel, Lipid panel  Primary hypertension - Plan: Comprehensive metabolic panel  Screening for malignant neoplasm of prostate - Plan: PSA  BRBPR (bright red blood per rectum) - Plan: CBC  -Phone number provided to schedule overdue colonoscopy.  Has not had recurrence of bright red blood per rectum but will check CBC and RTC precautions discussed if return of symptoms.  Screening for thyroid disorder - Plan: TSH   Meds ordered this encounter  Medications   atorvastatin (LIPITOR) 40 MG tablet    Sig: Take 1 tablet (40 mg total) by mouth daily.    Dispense:  90 tablet    Refill:  3   lisinopril (ZESTRIL) 40 MG tablet    Sig: Take 1 tablet (40 mg total) by mouth daily.    Dispense:  90 tablet    Refill:  1   Patient Instructions  Try increasing lisinopril to 40mg  per day. If any low blood pressures or new side effects, return to prior dose.  Watch diet, and and sodium in diet.  Try to minimize fast food - treat once per week as option.  Healthier snacks during the day.  Try walking for exercise - initially on day off - walking for 15 mins or on bike. Increase time and frequency of exercise to goal of 5 days per week and 150 mins per week.   Call [facility] to schedule colonoscopy. [phone]. Be seen right away if any return of blood in stool.   I do recommend 2nd covid booster.   Thanks for coming in today.   Return to the clinic or  go to the nearest emergency room if any of your symptoms worsen or new symptoms occur.   Managing Your Hypertension Hypertension, also called high blood pressure, is when the force of the blood pressing against the walls of the arteries is too strong. Arteries are blood vessels that carry blood from your heart throughout your body. Hypertension forces the heart to work harder to pump blood and may cause the arteries  tobecome narrow or stiff. Understanding blood pressure readings Your personal target blood pressure may vary depending on your medical conditions, your age, and other factors. A blood pressure reading includes a higher number over a lower number. Ideally, your blood pressure should be below 120/80. You should know that: The first, or top, number is called the systolic pressure. It is a measure of the pressure in your arteries as your heart beats. The second, or bottom number, is called the diastolic pressure. It is a measure of the pressure in your arteries as the heart relaxes. Blood pressure is classified into four stages. Based on your blood pressure reading, your health care provider may use the following stages to determine what type of treatment you need, if any. Systolic pressure and diastolicpressure are measured in a unit called mmHg. Normal Systolic pressure: below 229. Diastolic pressure: below 80. Elevated Systolic pressure: 798-921. Diastolic pressure: below 80. Hypertension stage 1 Systolic pressure: 194-174. Diastolic pressure: 08-14. Hypertension stage 2 Systolic pressure: 481 or above. Diastolic pressure: 90 or above. How can this condition affect me? Managing your hypertension is an important responsibility. Over time, hypertension can damage the arteries and decrease blood flow to important parts of the body, including the brain, heart, and kidneys. Having untreated or uncontrolled hypertension can lead to: A heart attack. A stroke. A weakened blood vessel (aneurysm). Heart failure. Kidney damage. Eye damage. Metabolic syndrome. Memory and concentration problems. Vascular dementia. What actions can I take to manage this condition? Hypertension can be managed by making lifestyle changes and possibly by taking medicines. Your health care provider will help you make a plan to bring yourblood pressure within a normal range. Nutrition  Eat a diet that is high in fiber  and potassium, and low in salt (sodium), added sugar, and fat. An example eating plan is called the Dietary Approaches to Stop Hypertension (DASH) diet. To eat this way: Eat plenty of fresh fruits and vegetables. Try to fill one-half of your plate at each meal with fruits and vegetables. Eat whole grains, such as whole-wheat pasta, brown rice, or whole-grain bread. Fill about one-fourth of your plate with whole grains. Eat low-fat dairy products. Avoid fatty cuts of meat, processed or cured meats, and poultry with skin. Fill about one-fourth of your plate with lean proteins such as fish, chicken without skin, beans, eggs, and tofu. Avoid pre-made and processed foods. These tend to be higher in sodium, added sugar, and fat. Reduce your daily sodium intake. Most people with hypertension should eat less than 1,500 mg of sodium a day.  Lifestyle  Work with your health care provider to maintain a healthy body weight or to lose weight. Ask what an ideal weight is for you. Get at least 30 minutes of exercise that causes your heart to beat faster (aerobic exercise) most days of the week. Activities may include walking, swimming, or biking. Include exercise to strengthen your muscles (resistance exercise), such as weight lifting, as part of your weekly exercise routine. Try to do these types of exercises for 30 minutes at least 3  days a week. Do not use any products that contain nicotine or tobacco, such as cigarettes, e-cigarettes, and chewing tobacco. If you need help quitting, ask your health care provider. Control any long-term (chronic) conditions you have, such as high cholesterol or diabetes. Identify your sources of stress and find ways to manage stress. This may include meditation, deep breathing, or making time for fun activities.  Alcohol use Do not drink alcohol if: Your health care provider tells you not to drink. You are pregnant, may be pregnant, or are planning to become pregnant. If you  drink alcohol: Limit how much you use to: 0-1 drink a day for women. 0-2 drinks a day for men. Be aware of how much alcohol is in your drink. In the [place] one drink equals one 12 oz bottle of beer (355 mL), one 5 oz glass of wine (148 mL), or one 1 oz glass of hard liquor (44 mL). Medicines Your health care provider may prescribe medicine if lifestyle changes are not enough to get your blood pressure under control and if: Your systolic blood pressure is 130 or higher. Your diastolic blood pressure is 80 or higher. Take medicines only as told by your health care provider. Follow the directions carefully. Blood pressure medicines must be taken as told by your health care provider. The medicine does not work as well when you skip doses. Skippingdoses also puts you at risk for problems. Monitoring Before you monitor your blood pressure: Do not smoke, drink caffeinated beverages, or exercise within 30 minutes before taking a measurement. Use the bathroom and empty your bladder (urinate). Sit quietly for at least 5 minutes before taking measurements. Monitor your blood pressure at home as told by your health care provider. To do this: Sit with your back straight and supported. Place your feet flat on the floor. Do not cross your legs. Support your arm on a flat surface, such as a table. Make sure your upper arm is at heart level. Each time you measure, take two or three readings one minute apart and record the results. You may also need to have your blood pressure checked regularly by your healthcare provider. General information Talk with your health care provider about your diet, exercise habits, and other lifestyle factors that may be contributing to hypertension. Review all the medicines you take with your health care provider because there may be side effects or interactions. Keep all visits as told by your health care provider. Your health care provider can help you create and adjust your  plan for managing your high blood pressure. Where to find more information National Heart, Lung, and Blood Institute: https://wilson-eaton.com/ American Heart Association: www.heart.org Contact a health care provider if: You think you are having a reaction to medicines you have taken. You have repeated (recurrent) headaches. You feel dizzy. You have swelling in your ankles. You have trouble with your vision. Get help right away if: You develop a severe headache or confusion. You have unusual weakness or numbness, or you feel faint. You have severe pain in your chest or abdomen. You vomit repeatedly. You have trouble breathing. These symptoms may represent a serious problem that is an emergency. Do not wait to see if the symptoms will go away. Get medical help right away. Call your local emergency services (911 in the [place] Do not drive yourself to the hospital. Summary Hypertension is when the force of blood pumping through your arteries is too strong. If this condition is not controlled, it may  put you at risk for serious complications. Your personal target blood pressure may vary depending on your medical conditions, your age, and other factors. For most people, a normal blood pressure is less than 120/80. Hypertension is managed by lifestyle changes, medicines, or both. Lifestyle changes to help manage hypertension include losing weight, eating a healthy, low-sodium diet, exercising more, stopping smoking, and limiting alcohol. This information is not intended to replace advice given to you by your health care provider. Make sure you discuss any questions you have with your healthcare provider. Document Revised: 04/16/2019 Document Reviewed: 02/09/2019 Elsevier Patient Education  2022 [place] you healthy  Get these tests Blood pressure- Have your blood pressure checked once a year by your healthcare provider.  Normal blood pressure is 120/80 Weight- Have your body mass  index (BMI) calculated to screen for obesity.  BMI is a measure of body fat based on height and weight. You can also calculate your own BMI at ViewBanking.si. Cholesterol- Have your cholesterol checked every year. Diabetes- Have your blood sugar checked regularly if you have high blood pressure, high cholesterol, have a family history of diabetes or if you are overweight. Screening for Colon Cancer- Colonoscopy starting at age 28.  Screening may begin sooner depending on your family history and other health conditions. Follow up colonoscopy as directed by your Gastroenterologist. Screening for Prostate Cancer- Both blood work (PSA) and a rectal exam help screen for Prostate Cancer.  Screening begins at age 63 with African-American men and at age 74 with Caucasian men.  Screening may begin sooner depending on your family history.  Take these medicinesFlu shot- Every fall. Tetanus- Every 10 years. Pneumonia shot- Once after the age of 68; if you are younger than 30, ask your healthcare provider if you need a Pneumonia shot.  Take these steps Don't smoke- If you do smoke, talk to your doctor about quitting.  For tips on how to quit, go to www.smokefree.gov or call 1-800-QUIT-NOW. Be physically active- Exercise 5 days a week for at least 30 minutes.  If you are not already physically active start slow and gradually work up to 30 minutes of moderate physical activity.  Examples of moderate activity include walking briskly, mowing the yard, dancing, swimming, bicycling, etc. Eat a healthy diet- Eat a variety of healthy food such as fruits, vegetables, low fat milk, low fat cheese, yogurt, lean meant, poultry, fish, beans, tofu, etc. For more information go to www.thenutritionsource.org Drink alcohol in moderation- Limit alcohol intake to less than two drinks a day. Never drink and drive. Dentist- Brush and floss twice daily; visit your dentist twice a year. Depression- Your emotional health is as  important as your physical health. If you're feeling down, or losing interest in things you would normally enjoy please talk to your healthcare provider. Eye exam- Visit your eye doctor every year. Safe sex- If you may be exposed to a sexually transmitted infection, use a condom. Seat belts- Seat belts can save your life; always wear one. Smoke/Carbon Monoxide detectors- These detectors need to be installed on the appropriate level of your home.  Replace batteries at least once a year. Skin cancer- When out in the sun, cover up and use sunscreen 15 SPF or higher. Violence- If anyone is threatening you, please tell your healthcare provider. Living Will/ Health care power of attorney- Speak with your healthcare provider and family.    Signed,   [name], MD [place], [facility] 10/04/20 3:29 PM

## 2020-10-04 NOTE — Patient Instructions (Addendum)
Try increasing lisinopril to 40mg  per day. If any low blood pressures or new side effects, return to prior dose.  Watch diet, and and sodium in diet.  Try to minimize fast food - treat once per week as option.  Healthier snacks during the day.  Try walking for exercise - initially on day off - walking for 15 mins or on bike. Increase time and frequency of exercise to goal of 5 days per week and 150 mins per week.   Call [facility] to schedule colonoscopy. [phone]. Be seen right away if any return of blood in stool.   I do recommend 2nd covid booster.   Thanks for coming in today.   Return to the clinic or go to the nearest emergency room if any of your symptoms worsen or new symptoms occur.   Managing Your Hypertension Hypertension, also called high blood pressure, is when the force of the blood pressing against the walls of the arteries is too strong. Arteries are blood vessels that carry blood from your heart throughout your body. Hypertension forces the heart to work harder to pump blood and may cause the arteries tobecome narrow or stiff. Understanding blood pressure readings Your personal target blood pressure may vary depending on your medical conditions, your age, and other factors. A blood pressure reading includes a higher number over a lower number. Ideally, your blood pressure should be below 120/80. You should know that: The first, or top, number is called the systolic pressure. It is a measure of the pressure in your arteries as your heart beats. The second, or bottom number, is called the diastolic pressure. It is a measure of the pressure in your arteries as the heart relaxes. Blood pressure is classified into four stages. Based on your blood pressure reading, your health care provider may use the following stages to determine what type of treatment you need, if any. Systolic pressure and diastolicpressure are measured in a unit called mmHg. Normal Systolic pressure: below  120. Diastolic pressure: below 80. Elevated Systolic pressure: 500-938. Diastolic pressure: below 80. Hypertension stage 1 Systolic pressure: 182-993. Diastolic pressure: 71-69. Hypertension stage 2 Systolic pressure: 678 or above. Diastolic pressure: 90 or above. How can this condition affect me? Managing your hypertension is an important responsibility. Over time, hypertension can damage the arteries and decrease blood flow to important parts of the body, including the brain, heart, and kidneys. Having untreated or uncontrolled hypertension can lead to: A heart attack. A stroke. A weakened blood vessel (aneurysm). Heart failure. Kidney damage. Eye damage. Metabolic syndrome. Memory and concentration problems. Vascular dementia. What actions can I take to manage this condition? Hypertension can be managed by making lifestyle changes and possibly by taking medicines. Your health care provider will help you make a plan to bring yourblood pressure within a normal range. Nutrition  Eat a diet that is high in fiber and potassium, and low in salt (sodium), added sugar, and fat. An example eating plan is called the Dietary Approaches to Stop Hypertension (DASH) diet. To eat this way: Eat plenty of fresh fruits and vegetables. Try to fill one-half of your plate at each meal with fruits and vegetables. Eat whole grains, such as whole-wheat pasta, brown rice, or whole-grain bread. Fill about one-fourth of your plate with whole grains. Eat low-fat dairy products. Avoid fatty cuts of meat, processed or cured meats, and poultry with skin. Fill about one-fourth of your plate with lean proteins such as fish, chicken without skin, beans, eggs,  and tofu. Avoid pre-made and processed foods. These tend to be higher in sodium, added sugar, and fat. Reduce your daily sodium intake. Most people with hypertension should eat less than 1,500 mg of sodium a day.  Lifestyle  Work with your health care  provider to maintain a healthy body weight or to lose weight. Ask what an ideal weight is for you. Get at least 30 minutes of exercise that causes your heart to beat faster (aerobic exercise) most days of the week. Activities may include walking, swimming, or biking. Include exercise to strengthen your muscles (resistance exercise), such as weight lifting, as part of your weekly exercise routine. Try to do these types of exercises for 30 minutes at least 3 days a week. Do not use any products that contain nicotine or tobacco, such as cigarettes, e-cigarettes, and chewing tobacco. If you need help quitting, ask your health care provider. Control any long-term (chronic) conditions you have, such as high cholesterol or diabetes. Identify your sources of stress and find ways to manage stress. This may include meditation, deep breathing, or making time for fun activities.  Alcohol use Do not drink alcohol if: Your health care provider tells you not to drink. You are pregnant, may be pregnant, or are planning to become pregnant. If you drink alcohol: Limit how much you use to: 0-1 drink a day for women. 0-2 drinks a day for men. Be aware of how much alcohol is in your drink. In the [place] one drink equals one 12 oz bottle of beer (355 mL), one 5 oz glass of wine (148 mL), or one 1 oz glass of hard liquor (44 mL). Medicines Your health care provider may prescribe medicine if lifestyle changes are not enough to get your blood pressure under control and if: Your systolic blood pressure is 130 or higher. Your diastolic blood pressure is 80 or higher. Take medicines only as told by your health care provider. Follow the directions carefully. Blood pressure medicines must be taken as told by your health care provider. The medicine does not work as well when you skip doses. Skippingdoses also puts you at risk for problems. Monitoring Before you monitor your blood pressure: Do not smoke, drink caffeinated  beverages, or exercise within 30 minutes before taking a measurement. Use the bathroom and empty your bladder (urinate). Sit quietly for at least 5 minutes before taking measurements. Monitor your blood pressure at home as told by your health care provider. To do this: Sit with your back straight and supported. Place your feet flat on the floor. Do not cross your legs. Support your arm on a flat surface, such as a table. Make sure your upper arm is at heart level. Each time you measure, take two or three readings one minute apart and record the results. You may also need to have your blood pressure checked regularly by your healthcare provider. General information Talk with your health care provider about your diet, exercise habits, and other lifestyle factors that may be contributing to hypertension. Review all the medicines you take with your health care provider because there may be side effects or interactions. Keep all visits as told by your health care provider. Your health care provider can help you create and adjust your plan for managing your high blood pressure. Where to find more information National Heart, Lung, and Blood Institute: https://wilson-eaton.com/ American Heart Association: www.heart.org Contact a health care provider if: You think you are having a reaction to medicines you have taken.  You have repeated (recurrent) headaches. You feel dizzy. You have swelling in your ankles. You have trouble with your vision. Get help right away if: You develop a severe headache or confusion. You have unusual weakness or numbness, or you feel faint. You have severe pain in your chest or abdomen. You vomit repeatedly. You have trouble breathing. These symptoms may represent a serious problem that is an emergency. Do not wait to see if the symptoms will go away. Get medical help right away. Call your local emergency services (911 in the [place] Do not drive yourself to the  hospital. Summary Hypertension is when the force of blood pumping through your arteries is too strong. If this condition is not controlled, it may put you at risk for serious complications. Your personal target blood pressure may vary depending on your medical conditions, your age, and other factors. For most people, a normal blood pressure is less than 120/80. Hypertension is managed by lifestyle changes, medicines, or both. Lifestyle changes to help manage hypertension include losing weight, eating a healthy, low-sodium diet, exercising more, stopping smoking, and limiting alcohol. This information is not intended to replace advice given to you by your health care provider. Make sure you discuss any questions you have with your healthcare provider. Document Revised: 04/16/2019 Document Reviewed: 02/09/2019 Elsevier Patient Education  2022 [place] you healthy  Get these tests Blood pressure- Have your blood pressure checked once a year by your healthcare provider.  Normal blood pressure is 120/80 Weight- Have your body mass index (BMI) calculated to screen for obesity.  BMI is a measure of body fat based on height and weight. You can also calculate your own BMI at ViewBanking.si. Cholesterol- Have your cholesterol checked every year. Diabetes- Have your blood sugar checked regularly if you have high blood pressure, high cholesterol, have a family history of diabetes or if you are overweight. Screening for Colon Cancer- Colonoscopy starting at age 37.  Screening may begin sooner depending on your family history and other health conditions. Follow up colonoscopy as directed by your Gastroenterologist. Screening for Prostate Cancer- Both blood work (PSA) and a rectal exam help screen for Prostate Cancer.  Screening begins at age 10 with African-American men and at age 78 with Caucasian men.  Screening may begin sooner depending on your family history.  Take these  medicinesFlu shot- Every fall. Tetanus- Every 10 years. Pneumonia shot- Once after the age of 72; if you are younger than 15, ask your healthcare provider if you need a Pneumonia shot.  Take these steps Don't smoke- If you do smoke, talk to your doctor about quitting.  For tips on how to quit, go to www.smokefree.gov or call 1-800-QUIT-NOW. Be physically active- Exercise 5 days a week for at least 30 minutes.  If you are not already physically active start slow and gradually work up to 30 minutes of moderate physical activity.  Examples of moderate activity include walking briskly, mowing the yard, dancing, swimming, bicycling, etc. Eat a healthy diet- Eat a variety of healthy food such as fruits, vegetables, low fat milk, low fat cheese, yogurt, lean meant, poultry, fish, beans, tofu, etc. For more information go to www.thenutritionsource.org Drink alcohol in moderation- Limit alcohol intake to less than two drinks a day. Never drink and drive. Dentist- Brush and floss twice daily; visit your dentist twice a year. Depression- Your emotional health is as important as your physical health. If you're feeling down, or losing interest in things you  would normally enjoy please talk to your healthcare provider. Eye exam- Visit your eye doctor every year. Safe sex- If you may be exposed to a sexually transmitted infection, use a condom. Seat belts- Seat belts can save your life; always wear one. Smoke/Carbon Monoxide detectors- These detectors need to be installed on the appropriate level of your home.  Replace batteries at least once a year. Skin cancer- When out in the sun, cover up and use sunscreen 15 SPF or higher. Violence- If anyone is threatening you, please tell your healthcare provider. Living Will/ Health care power of attorney- Speak with your healthcare provider and family.

## 2020-10-05 LAB — HEMOGLOBIN A1C: Hgb A1c MFr Bld: 6.2 % (ref 4.6–6.5)

## 2020-10-05 LAB — CBC
HCT: 45.1 % (ref 39.0–52.0)
Hemoglobin: 15.4 g/dL (ref 13.0–17.0)
MCHC: 34.2 g/dL (ref 30.0–36.0)
MCV: 90.3 fl (ref 78.0–100.0)
Platelets: 248 10*3/uL (ref 150.0–400.0)
RBC: 5 Mil/uL (ref 4.22–5.81)
RDW: 14.2 % (ref 11.5–15.5)
WBC: 8.6 10*3/uL (ref 4.0–10.5)

## 2020-10-05 LAB — LIPID PANEL
Cholesterol: 193 mg/dL (ref 0–200)
HDL: 52.5 mg/dL (ref >39.00)
LDL Cholesterol: 102 mg/dL — ABNORMAL HIGH (ref 0–99)
NonHDL: 140.66
Total CHOL/HDL Ratio: 4
Triglycerides: 192 mg/dL — ABNORMAL HIGH (ref 0.0–149.0)
VLDL: 38.4 mg/dL (ref 0.0–40.0)

## 2020-10-05 LAB — COMPREHENSIVE METABOLIC PANEL
ALT: 23 U/L (ref 0–53)
AST: 19 U/L (ref 0–37)
Albumin: 4.6 g/dL (ref 3.5–5.2)
Alkaline Phosphatase: 57 U/L (ref 39–117)
BUN: 21 mg/dL (ref 6–23)
CO2: 28 mEq/L (ref 19–32)
Calcium: 9.5 mg/dL (ref 8.4–10.5)
Chloride: 104 mEq/L (ref 96–112)
Creatinine, Ser: 0.89 mg/dL (ref 0.40–1.50)
GFR: 96.17 mL/min (ref >60.00)
Glucose, Bld: 84 mg/dL (ref 70–99)
Potassium: 4.1 mEq/L (ref 3.5–5.1)
Sodium: 141 mEq/L (ref 135–145)
Total Bilirubin: 0.7 mg/dL (ref 0.2–1.2)
Total Protein: 7.2 g/dL (ref 6.0–8.3)

## 2020-10-05 LAB — TSH: TSH: 1.56 u[IU]/mL (ref 0.35–5.50)

## 2020-10-05 LAB — PSA: PSA: 0.79 ng/mL (ref 0.10–4.00)

## 2020-12-08 ENCOUNTER — Encounter: Payer: Self-pay | Admitting: Gastroenterology

## 2021-01-10 ENCOUNTER — Telehealth: Payer: Self-pay | Admitting: Gastroenterology

## 2021-01-10 ENCOUNTER — Ambulatory Visit (INDEPENDENT_AMBULATORY_CARE_PROVIDER_SITE_OTHER): Payer: 59 | Admitting: Gastroenterology

## 2021-01-10 ENCOUNTER — Encounter: Payer: Self-pay | Admitting: Gastroenterology

## 2021-01-10 VITALS — BP 164/100 | HR 88 | Ht 67.5 in | Wt 235.0 lb

## 2021-01-10 DIAGNOSIS — Z86010 Personal history of colonic polyps: Secondary | ICD-10-CM

## 2021-01-10 DIAGNOSIS — R1310 Dysphagia, unspecified: Secondary | ICD-10-CM

## 2021-01-10 DIAGNOSIS — K219 Gastro-esophageal reflux disease without esophagitis: Secondary | ICD-10-CM

## 2021-01-10 DIAGNOSIS — K625 Hemorrhage of anus and rectum: Secondary | ICD-10-CM | POA: Diagnosis not present

## 2021-01-10 MED ORDER — NA SULFATE-K SULFATE-MG SULF 17.5-3.13-1.6 GM/177ML PO SOLN: 1.0000 | ORAL | 0 refills | Status: DC

## 2021-01-10 MED ORDER — OMEPRAZOLE 40 MG PO CPDR: 40.0000 mg | DELAYED_RELEASE_CAPSULE | Freq: Every day | ORAL | 6 refills | Status: DC

## 2021-01-10 NOTE — Patient Instructions (Signed)
If you are age 56 or younger, your body mass index should be between 19-25. Your Body mass index is 36.26 kg/m. If this is out of the aformentioned range listed, please consider follow up with your Primary Care Provider.  ________________________________________________________  The  GI providers would like to encourage you to use [facility] to communicate with providers for non-urgent requests or questions.  Due to long hold times on the telephone, sending your provider a message by [facility] may be a faster and more efficient way to get a response.  Please allow 48 business hours for a response.  Please remember that this is for non-urgent requests.  _______________________________________________________  Dennis Bast have been scheduled for an endoscopy and colonoscopy. Please follow the written instructions given to you at your visit today. Please pick up your prep supplies at the pharmacy within the next 1-3 days. If you use inhalers (even only as needed), please bring them with you on the day of your procedure.  Due to recent changes in healthcare laws, you may see the results of your imaging and laboratory studies on MyChart before your provider has had a chance to review them.  We understand that in some cases there may be results that are confusing or concerning to you. Not all laboratory results come back in the same time frame and the provider may be waiting for multiple results in order to interpret others.  Please give [place] 48 hours in order for your provider to thoroughly review all the results before contacting the office for clarification of your results.   We have sent the following medications to your pharmacy for you to pick up at your convenience:  START: Omeprazole 40mg  one capsule 20-30 minutes before breakfast daily.  Thank you for entrusting me with your care and choosing [facility].  Dr Ardis Hughs

## 2021-01-10 NOTE — Telephone Encounter (Signed)
Inbound call from [facility]. States Suprep is not covered and need a substitution.

## 2021-01-10 NOTE — Progress Notes (Signed)
HPI: This is a very pleasant 55 year old man who was referred to me by Wendie Agreste, MD  to evaluate rectal bleeding, GERD, dysphagia.    I did a colonoscopy October 2016 for routine risk screening and found a single subcentimeter adenoma.  Blood work July 2022 showed normal CBC and normal complete metabolic profile  He normally has 1-2 soft formed bowel movements daily.  About 2 months ago he started having bright red blood per rectum in the stool for about a week.  1 month ago he took a product called T burn to help lose weight and he had significant stomach discomforts the morning afterwards and had bleeding rectally that day as well.  His bowels are more back to normal now.  He tells me when he was 10 he had to have some type of hemorrhoidal surgery.  He also has chronic GERD.  He takes Tums with some moderate relief of his pyrosis.  He has also had dysphagia for at least a year or 2.  This is to solids.  It is nonprogressive.  This occurs 2-3 times per week.  His weight is up in the past several months  Review of systems: Pertinent positive and negative review of systems were noted in the above HPI section. All other review negative.   Past Medical History:  Diagnosis Date   Hypercholesteremia    Hypertension     Past Surgical History:  Procedure Laterality Date   APPENDECTOMY     SPINE SURGERY     Cervical spine surgery   TONSILLECTOMY      Current Outpatient Medications  Medication Sig Dispense Refill   aspirin 81 MG chewable tablet Chew 81 mg by mouth daily.     atorvastatin (LIPITOR) 40 MG tablet Take 1 tablet (40 mg total) by mouth daily. 90 tablet 3   fish oil-omega-3 fatty acids 1000 MG capsule Take 1 g by mouth daily.     lisinopril (ZESTRIL) 40 MG tablet Take 1 tablet (40 mg total) by mouth daily. 90 tablet 1   LYSINE PO Take 1 tablet by mouth daily.     No current facility-administered medications for this visit.    Allergies as of 01/10/2021   (No Known  Allergies)    Family History  Problem Relation Age of Onset   Hyperlipidemia Mother    Hypertension Mother    Cancer Father 70       throat   Heart disease Father 31       AMI/CABG/valve replacement   COPD Brother    Graves' disease Brother    Alcohol abuse Brother    Diabetes Paternal Grandmother    Colon cancer Neg Hx     Social History   Socioeconomic History   Marital status: Single    Spouse name: Not on file   Number of children: 2   Years of education: Not on file   Highest education level: Not on file  Occupational History   Occupation: truck driver    Comment: [employer]  Tobacco Use   Smoking status: Never   Smokeless tobacco: Never  Substance and Sexual Activity   Alcohol use: No   Drug use: No   Sexual activity: Yes  Other Topics Concern   Not on file  Social History Narrative   Marital status: Single; not dating      Children: 2 children/adopted.  No grandchildren.      Education: [employer].      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      Tobacco: none      Alcohol: none      Drugs: none      Exercise:  Weight room in house; lifts weights.      Seatbelt: 100%; no texting      Sexual activity: none in past year; no STDs.  Females only.           Social Determinants of Health   Financial Resource Strain: Not on file  Food Insecurity: Not on file  Transportation Needs: Not on file  Physical Activity: Not on file  Stress: Not on file  Social Connections: Not on file  Intimate Partner Violence: Not on file     Physical Exam: BP (!) 164/100   Pulse 88   Ht 5' 7.5" (1.715 m)   Wt 235 lb (106.6 kg)   BMI 36.26 kg/m  Constitutional: generally well-appearing Psychiatric: alert and oriented x3 Eyes: extraocular movements intact Mouth: oral pharynx moist, no lesions Neck: supple no lymphadenopathy Cardiovascular: heart regular rate and rhythm Lungs: clear to auscultation bilaterally Abdomen:  soft, nontender, nondistended, no obvious ascites, no peritoneal signs, normal bowel sounds Extremities: no lower extremity edema bilaterally Skin: no lesions on visible extremities   Assessment and plan: 55 y.o. male with history of adenomatous polyps, recent rectal bleeding, GERD, dysphagia  I recommended colonoscopy and EGD at his soonest convenience for the above issues.  I think it is unlikely that he has a serious cause of his minor rectal bleeding.  I explained to him that acid regurg might be playing a role in his dysphagia and certainly is probably causing his pyrosis as well.  I recommend that he stop taking the Tums and instead he will start taking omeprazole 40 mg pills 1 pill once daily shortly for his breakfast meal every day until the time of his upper endoscopy at least.  Please see the "Patient Instructions" section for addition details about the plan.   Owens Loffler, MD [facility] 01/10/2021, 2:33 PM  Cc: Wendie Agreste, MD  Total time on date of encounter was 45 minutes (this included time spent preparing to see the patient reviewing records; obtaining and/or reviewing separately obtained history; performing a medically appropriate exam and/or evaluation; counseling and educating the patient and family if present; ordering medications, tests or procedures if applicable; and documenting clinical information in the health record).

## 2021-01-11 NOTE — Telephone Encounter (Signed)
Patient returned call stating that Suprep is not affordable.  I advised patient that he will complete a miralax prep which will be much more affordable for him.  Patient agreed to plan and verbalized understanding. New instructions sent via MyChart.

## 2021-01-11 NOTE — Telephone Encounter (Signed)
Patient call to follow up on an alternative prep

## 2021-01-24 ENCOUNTER — Telehealth (INDEPENDENT_AMBULATORY_CARE_PROVIDER_SITE_OTHER): Payer: 59 | Admitting: Family Medicine

## 2021-01-24 ENCOUNTER — Encounter: Payer: Self-pay | Admitting: Family Medicine

## 2021-01-24 ENCOUNTER — Other Ambulatory Visit: Payer: Self-pay

## 2021-01-24 DIAGNOSIS — J22 Unspecified acute lower respiratory infection: Secondary | ICD-10-CM | POA: Diagnosis not present

## 2021-01-24 DIAGNOSIS — R059 Cough, unspecified: Secondary | ICD-10-CM

## 2021-01-24 MED ORDER — BENZONATATE 200 MG PO CAPS
200.0000 mg | ORAL_CAPSULE | Freq: Two times a day (BID) | ORAL | 0 refills | Status: DC | PRN
Start: 2021-01-24 — End: 2021-03-08

## 2021-01-24 MED ORDER — AZITHROMYCIN 250 MG PO TABS: ORAL_TABLET | ORAL | 0 refills | Status: AC

## 2021-01-24 NOTE — Progress Notes (Signed)
Virtual Visit via Telephone Note  I connected with Bryce Bush. on 01/24/21 at 11:13 AM by telephone and verified that I am speaking with the correct person using two identifiers. Patient location:in truck. Pulled over.  My location: office - [place] Unable to have video visit as working.    I discussed the limitations, risks, security and privacy concerns of performing an evaluation and management service by telephone and the availability of in person appointments. I also discussed with the patient that there may be a patient responsible charge related to this service. The patient expressed understanding and agreed to proceed, consent obtained  Chief complaint:  Chief Complaint  Patient presents with   Cough    Pt reports chest congestion, cough, pt notes productive cough, SOB with coughing fits, some pain in Rt ear with swallowing, started about 1 week ago, tired alkeseltzer cold relief and mucinex max felt okay but not as effective now, denies fever     History of Present Illness:  Cough: Started 1 week ago. Chest congestion.  Productive cough, white-yellow mucus past week.  Feels same, not improving. Initially thought better, than recurs. R ear clicking with swallowing,  Has not performed covid test.   No fever, no dyspnea (only with cough). HA with cough only. No sore throat.  More cough at night with lying down.   Tx: alka seltzer cold relief, mucinex. Helped to sleep usually. Meds initially helped, but still some coughing fits.   Has not had flu vaccine.  Previous COVID infection as well as COVID-vaccine.  No known sick contacts.  Feels well otherwise.  Still able to work.   Patient Active Problem List   Diagnosis Date Noted   Class 1 obesity due to excess calories with serious comorbidity and body mass index (BMI) of 32.0 to 32.9 in adult 12/22/2016   HTN (hypertension) 03/11/2012   Hyperlipidemia 03/11/2012   Past Medical History:  Diagnosis Date    Hypercholesteremia    Hypertension    Past Surgical History:  Procedure Laterality Date   APPENDECTOMY     SPINE SURGERY     Cervical spine surgery   TONSILLECTOMY     No Known Allergies Prior to Admission medications   Medication Sig Start Date End Date Taking? Authorizing Provider  aspirin 81 MG chewable tablet Chew 81 mg by mouth daily.   Yes [provider]  atorvastatin (LIPITOR) 40 MG tablet Take 1 tablet (40 mg total) by mouth daily. 10/04/20  Yes Wendie Agreste, MD  fish oil-omega-3 fatty acids 1000 MG capsule Take 1 g by mouth daily.   Yes [provider]  lisinopril (ZESTRIL) 40 MG tablet Take 1 tablet (40 mg total) by mouth daily. 10/04/20  Yes Wendie Agreste, MD  LYSINE PO Take 1 tablet by mouth daily.   Yes [provider]  omeprazole (PRILOSEC) 40 MG capsule Take 1 capsule (40 mg total) by mouth daily. 01/10/21  Yes Milus Banister, MD   Social History   Socioeconomic History   Marital status: Single    Spouse name: Not on file   Number of children: 2   Years of education: Not on file   Highest education level: Not on file  Occupational History   Occupation: truck driver    Comment: [employer]  Tobacco Use   Smoking status: Never   Smokeless tobacco: Never  Substance and Sexual Activity   Alcohol use: No   Drug use: No   Sexual activity: Yes  Other Topics Concern   Not on file  Social History Narrative   Marital status: Single; not dating      Children: 2 children/adopted.  No grandchildren.      Education: [employer].      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      Tobacco: none      Alcohol: none      Drugs: none      Exercise:  Weight room in house; lifts weights.      Seatbelt: 100%; no texting      Sexual activity: none in past year; no STDs.  Females only.           Social Determinants of Health   Financial Resource Strain: Not on file  Food Insecurity: Not on file   Transportation Needs: Not on file  Physical Activity: Not on file  Stress: Not on file  Social Connections: Not on file  Intimate Partner Violence: Not on file     Observations/Objective: There were no vitals filed for this visit. Speaking in full sentences on phone, appropriate and coherent responses.  No cough heard during conversation.  No audible wheeze or stridor.  All questions were answered with understanding of plan expressed  Assessment and Plan: Cough, unspecified type - Plan: benzonatate (TESSALON) 200 MG capsule, azithromycin (ZITHROMAX) 250 MG tablet  LRTI (lower respiratory tract infection) - Plan: benzonatate (TESSALON) 200 MG capsule, azithromycin (ZITHROMAX) 250 MG tablet  Possible initial viral illness, some improvement and then recurrence/plateau as above.  Possible early lower respiratory infection.  No fever or dyspnea.  Ok to hold on imaging and in person evaluation at this time.  Tessalon Perles added for cough, okay to continue Mucinex, start azithromycin, potential risks and side effects of antibiotics were discussed and all questions were answered.  ER/urgent care/RTC precautions given.  Flu vaccination recommended.   Follow Up Instructions: UC/ER precautions given.    I discussed the assessment and treatment plan with the patient. The patient was provided an opportunity to ask questions and all were answered. The patient agreed with the plan and demonstrated an understanding of the instructions.   The patient was advised to call back or seek an in-person evaluation if the symptoms worsen or if the condition fails to improve as anticipated.  I provided 12 minutes of non-face-to-face time during this encounter.  Signed,   [name], MD [facility].  01/24/21

## 2021-02-20 ENCOUNTER — Encounter: Payer: Self-pay | Admitting: Gastroenterology

## 2021-02-20 ENCOUNTER — Ambulatory Visit (AMBULATORY_SURGERY_CENTER): Payer: 59 | Admitting: Gastroenterology

## 2021-02-20 VITALS — BP 146/89 | HR 78 | Temp 98.6°F | Resp 16 | Ht 67.0 in | Wt 235.0 lb

## 2021-02-20 DIAGNOSIS — K648 Other hemorrhoids: Secondary | ICD-10-CM | POA: Diagnosis not present

## 2021-02-20 DIAGNOSIS — K625 Hemorrhage of anus and rectum: Secondary | ICD-10-CM | POA: Diagnosis present

## 2021-02-20 DIAGNOSIS — K621 Rectal polyp: Secondary | ICD-10-CM

## 2021-02-20 DIAGNOSIS — R1310 Dysphagia, unspecified: Secondary | ICD-10-CM | POA: Diagnosis not present

## 2021-02-20 DIAGNOSIS — R12 Heartburn: Secondary | ICD-10-CM

## 2021-02-20 DIAGNOSIS — Z8719 Personal history of other diseases of the digestive system: Secondary | ICD-10-CM

## 2021-02-20 MED ORDER — SODIUM CHLORIDE 0.9 % IV SOLN: 500.0000 mL | Freq: Once | INTRAVENOUS | Status: DC

## 2021-02-20 NOTE — Progress Notes (Signed)
Check in-AM    V/S-DT

## 2021-02-20 NOTE — Progress Notes (Signed)
Per Dr. Ardis Hughs, the report said "multilobulated, polypoid lesion".  I asked Dr. Ardis Hughs should I give pt a handout on colon polyps, he said "yes".  Also these 3 polypoid lesions were just inside the rectum.  Dr. Ardis Hughs said pt may have some rectal discomfort.  Take Tylenol if needed.  Pt and his son were both made aware of this. Maw  No problems noted in the recovery room. maw

## 2021-02-20 NOTE — Op Note (Signed)
[place] Patient Name: Bryce Bush Procedure Date: 02/20/2021 1:45 PM MRN: [id number] Endoscopist: Milus Banister , MD Age: 55 Referring MD:  Date of Birth: 15-May-1965 Gender: Male Account #: [id number] Procedure:                Colonoscopy Indications:              Rectal bleeding; colonoscopy October 2016 for                            routine risk screening and found a single                            subcentimeter adenoma. Medicines:                Monitored Anesthesia Care Procedure:                Pre-Anesthesia Assessment:                           - Prior to the procedure, a History and Physical                            was performed, and patient medications and                            allergies were reviewed. The patient's tolerance of                            previous anesthesia was also reviewed. The risks                            and benefits of the procedure and the sedation                            options and risks were discussed with the patient.                            All questions were answered, and informed consent                            was obtained. Prior Anticoagulants: The patient has                            taken no previous anticoagulant or antiplatelet                            agents. ASA Grade Assessment: II - A patient with                            mild systemic disease. After reviewing the risks                            and benefits, the patient was deemed in  satisfactory condition to undergo the procedure.                           After obtaining informed consent, the colonoscope                            was passed under direct vision. Throughout the                            procedure, the patient's blood pressure, pulse, and                            oxygen saturations were monitored continuously. The                            CF [id number] #[id number] was introduced through the anus                             and advanced to the the cecum, identified by                            appendiceal orifice and ileocecal valve. The                            colonoscopy was performed without difficulty. The                            patient tolerated the procedure well. The quality                            of the bowel preparation was good. The ileocecal                            valve, appendiceal orifice, and rectum were                            photographed. Scope In: 1:51:31 PM Scope Out: 2:06:26 PM Scope Withdrawal Time: 0 hours 12 minutes 34 seconds  Total Procedure Duration: 0 hours 14 minutes 55 seconds  Findings:                 There was a 1.5cm, multilobulated, soft, polypoid                            lesion in the very distal rectum. This was adjacent                            to the internal anal verge and I removed it with                            snare/cautery.                           Internal hemorrhoids were found. The hemorrhoids  were small.                           The exam was otherwise without abnormality on                            direct and retroflexion views. Complications:            No immediate complications. Estimated blood loss:                            None. Estimated Blood Loss:     Estimated blood loss: none. Impression:               - There was a 1.5cm, multilobulated, soft, polypoid                            lesion in the very distal rectum. This was adjacent                            to the internal anal verge and I removed it with                            snare/cautery.                           - Internal hemorrhoids.                           - The examination was otherwise normal on direct                            and retroflexion views. Recommendation:           - Await pathology results.                           - EGD now. Milus Banister, MD 02/20/2021 2:17:45 PM This report  has been signed electronically.

## 2021-02-20 NOTE — Patient Instructions (Addendum)
Handout were given to your care partner on polyps and hemorrhoids. Stay on OMEPRAZOLE 40 mg daily.  Best to take on an empty stomach 20-30 minutes before breakfast. You may resume your current medications today. Await biopsy results.  May take 1-3 weeks to receive pathology results. Please call if any questions or concerns     YOU HAD AN ENDOSCOPIC PROCEDURE TODAY AT [facility]:   Refer to the procedure report that was given to you for any specific questions about what was found during the examination.  If the procedure report does not answer your questions, please call your gastroenterologist to clarify.  If you requested that your care partner not be given the details of your procedure findings, then the procedure report has been included in a sealed envelope for you to review at your convenience later.  YOU SHOULD EXPECT: Some feelings of bloating in the abdomen. Passage of more gas than usual.  Walking can help get rid of the air that was put into your GI tract during the procedure and reduce the bloating. If you had a lower endoscopy (such as a colonoscopy or flexible sigmoidoscopy) you may notice spotting of blood in your stool or on the toilet paper. If you underwent a bowel prep for your procedure, you may not have a normal bowel movement for a few days.  Please Note:  You might notice some irritation and congestion in your nose or some drainage.  This is from the oxygen used during your procedure.  There is no need for concern and it should clear up in a day or so.  SYMPTOMS TO REPORT IMMEDIATELY:  Following lower endoscopy (colonoscopy or flexible sigmoidoscopy):  Excessive amounts of blood in the stool  Significant tenderness or worsening of abdominal pains  Swelling of the abdomen that is new, acute  Fever of 100F or higher  Following upper endoscopy (EGD)  Vomiting of blood or coffee ground material  New chest pain or pain under the shoulder blades  Painful  or persistently difficult swallowing  New shortness of breath  Fever of 100F or higher  Black, tarry-looking stools  For urgent or emergent issues, a gastroenterologist can be reached at any hour by calling [phone]. Do not use MyChart messaging for urgent concerns.    DIET:  We do recommend a small meal at first, but then you may proceed to your regular diet.  Drink plenty of fluids but you should avoid alcoholic beverages for 24 hours.  ACTIVITY:  You should plan to take it easy for the rest of today and you should NOT DRIVE or use heavy machinery until tomorrow (because of the sedation medicines used during the test).    FOLLOW UP: Our staff will call the number listed on your records 48-72 hours following your procedure to check on you and address any questions or concerns that you may have regarding the information given to you following your procedure. If we do not reach you, we will leave a message.  We will attempt to reach you two times.  During this call, we will ask if you have developed any symptoms of COVID 19. If you develop any symptoms (ie: fever, flu-like symptoms, shortness of breath, cough etc.) before then, please call [phone].  If you test positive for Covid 19 in the 2 weeks post procedure, please call and report this information to [place].    If any biopsies were taken you will be contacted by phone or by letter  within the next 1-3 weeks.  Please call us at [phone] if you have not heard about the biopsies in 3 weeks.    SIGNATURES/CONFIDENTIALITY: You and/or your care partner have signed paperwork which will be entered into your electronic medical record.  These signatures attest to the fact that that the information above on your After Visit Summary has been reviewed and is understood.  Full responsibility of the confidentiality of this discharge information lies with you and/or your care-partner. Marland Kitchen

## 2021-02-20 NOTE — Progress Notes (Signed)
A and O x3. Report to RN. Tolerated MAC anesthesia well.Teeth unchanged after procedure. 

## 2021-02-20 NOTE — Progress Notes (Signed)
Called to room to assist during endoscopic procedure.  Patient ID and intended procedure confirmed with present staff. Received instructions for my participation in the procedure from the performing physician.  

## 2021-02-20 NOTE — Progress Notes (Signed)
HPI: This is a man with h/o polyps, recent minor rectal bleeding, intermittent dysphagia   colonoscopy October 2016 for routine risk screening and found a single subcentimeter adenoma.   ROS: complete GI ROS as described in HPI, all other review negative.  Constitutional:  No unintentional weight loss   Past Medical History:  Diagnosis Date   GERD (gastroesophageal reflux disease)    Hypercholesteremia    Hypertension     Past Surgical History:  Procedure Laterality Date   APPENDECTOMY     motor vechile     SPINE SURGERY     Cervical spine surgery   TONSILLECTOMY      Current Outpatient Medications  Medication Sig Dispense Refill   aspirin 81 MG chewable tablet Chew 81 mg by mouth daily.     atorvastatin (LIPITOR) 40 MG tablet Take 1 tablet (40 mg total) by mouth daily. 90 tablet 3   benzonatate (TESSALON) 200 MG capsule Take 1 capsule (200 mg total) by mouth 2 (two) times daily as needed for cough. 20 capsule 0   fish oil-omega-3 fatty acids 1000 MG capsule Take 1 g by mouth daily.     lisinopril (ZESTRIL) 40 MG tablet Take 1 tablet (40 mg total) by mouth daily. 90 tablet 1   LYSINE PO Take 1 tablet by mouth daily.     omeprazole (PRILOSEC) 40 MG capsule Take 1 capsule (40 mg total) by mouth daily. 30 capsule 6   Current Facility-Administered Medications  Medication Dose Route Frequency Provider Last Rate Last Admin   0.9 %  sodium chloride infusion  500 mL Intravenous Once Milus Banister, MD        Allergies as of 02/20/2021   (No Known Allergies)    Family History  Problem Relation Age of Onset   Hyperlipidemia Mother    Hypertension Mother    Cancer Father 79       throat   Heart disease Father 31       AMI/CABG/valve replacement   COPD Brother    Graves' disease Brother    Alcohol abuse Brother    Diabetes Paternal Grandmother    Colon cancer Neg Hx    Colon polyps Neg Hx    Esophageal cancer Neg Hx    Rectal cancer Neg Hx    Stomach cancer Neg Hx      Social History   Socioeconomic History   Marital status: Single    Spouse name: Not on file   Number of children: 2   Years of education: Not on file   Highest education level: Not on file  Occupational History   Occupation: truck driver    Comment: [employer]  Tobacco Use   Smoking status: Never   Smokeless tobacco: Never  Substance and Sexual Activity   Alcohol use: No   Drug use: No   Sexual activity: Yes  Other Topics Concern   Not on file  Social History Narrative   Marital status: Single; not dating      Children: 2 children/adopted.  No grandchildren.      Education: [employer].      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      Tobacco: none      Alcohol: none      Drugs: none      Exercise:  Weight room in house; lifts weights.      Seatbelt: 100%; no texting  Sexual activity: none in past year; no STDs.  Females only.           Social Determinants of Health   Financial Resource Strain: Not on file  Food Insecurity: Not on file  Transportation Needs: Not on file  Physical Activity: Not on file  Stress: Not on file  Social Connections: Not on file  Intimate Partner Violence: Not on file     Physical Exam: BP 137/89 (Patient Position: Sitting)   Pulse 83   Temp 98.6 F (37 C)   Ht 5\' 7"  (1.702 m)   Wt 235 lb (106.6 kg)   SpO2 97%   BMI 36.81 kg/m  Constitutional: generally well-appearing Psychiatric: alert and oriented x3 Lungs: CTA bilaterally Heart: no MCR  Assessment and plan: 55 y.o. male with h/o polyps, minor rectal bleeding, GERD and intermittent dyspaghais  Colonoscoyp and egd today  Care is appropriate for the ambulatory setting.  Owens Loffler, MD [facility] 02/20/2021, 1:40 PM

## 2021-02-20 NOTE — Op Note (Signed)
[place] Patient Name: Bryce Bush Procedure Date: 02/20/2021 1:44 PM MRN: [id number] Endoscopist: Milus Banister , MD Age: 55 Referring MD:  Date of Birth: [birth year] Gender: Male Account #: [id number] Procedure:                Upper GI endoscopy Indications:              Dysphagia, Heartburn Medicines:                Monitored Anesthesia Care Procedure:                Pre-Anesthesia Assessment:                           - Prior to the procedure, a History and Physical                            was performed, and patient medications and                            allergies were reviewed. The patient's tolerance of                            previous anesthesia was also reviewed. The risks                            and benefits of the procedure and the sedation                            options and risks were discussed with the patient.                            All questions were answered, and informed consent                            was obtained. Prior Anticoagulants: The patient has                            taken no previous anticoagulant or antiplatelet                            agents. ASA Grade Assessment: II - A patient with                            mild systemic disease. After reviewing the risks                            and benefits, the patient was deemed in                            satisfactory condition to undergo the procedure.                           After obtaining informed consent, the endoscope was  passed under direct vision. Throughout the                            procedure, the patient's blood pressure, pulse, and                            oxygen saturations were monitored continuously. The                            Endoscope was introduced through the mouth, and                            advanced to the second part of duodenum. The upper                            GI endoscopy was accomplished without  difficulty.                            The patient tolerated the procedure well. Scope In: Scope Out: Findings:                 The esophagus was normal.                           The stomach was normal.                           The examined duodenum was normal. Complications:            No immediate complications. Estimated blood loss:                            None. Estimated Blood Loss:     Estimated blood loss: none. Impression:               - Normal esophagus.                           - Normal stomach.                           - Normal examined duodenum. Recommendation:           - Patient has a contact number available for                            emergencies. The signs and symptoms of potential                            delayed complications were discussed with the                            patient. Return to normal activities tomorrow.                            Written discharge instructions were provided to the  patient.                           - Resume previous diet.                           - Continue present medications. Stay on the once                            daily omeprazole, best taken shortly before                            breakfast. This seems to be helping your heartburn                            and swallowing troubles. Milus Banister, MD 02/20/2021 2:22:02 PM This report has been signed electronically.

## 2021-02-22 ENCOUNTER — Telehealth: Payer: Self-pay | Admitting: *Deleted

## 2021-02-22 NOTE — Telephone Encounter (Signed)
  Follow up Call-  Call back number 02/20/2021  Post procedure Call Back phone  # [phone]  Permission to leave phone message Yes  Some recent data might be hidden     Patient questions:  Do you have a fever, pain , or abdominal swelling? No. Pain Score  0 *  Have you tolerated food without any problems? Yes.    Have you been able to return to your normal activities? Yes.    Do you have any questions about your discharge instructions: Diet   No. Medications  No. Follow up visit  No.  Do you have questions or concerns about your Care? No.  Actions: * If pain score is 4 or above: No action needed, pain <4.

## 2021-03-08 ENCOUNTER — Encounter: Payer: Self-pay | Admitting: Family Medicine

## 2021-03-08 ENCOUNTER — Ambulatory Visit: Payer: 59 | Admitting: Family Medicine

## 2021-03-08 ENCOUNTER — Other Ambulatory Visit: Payer: Self-pay

## 2021-03-08 VITALS — BP 138/92 | HR 70 | Temp 98.2°F | Resp 18 | Ht 67.0 in | Wt 236.8 lb

## 2021-03-08 DIAGNOSIS — I10 Essential (primary) hypertension: Secondary | ICD-10-CM | POA: Diagnosis not present

## 2021-03-08 DIAGNOSIS — E782 Mixed hyperlipidemia: Secondary | ICD-10-CM | POA: Diagnosis not present

## 2021-03-08 DIAGNOSIS — R7303 Prediabetes: Secondary | ICD-10-CM | POA: Diagnosis not present

## 2021-03-08 DIAGNOSIS — Z6837 Body mass index (BMI) 37.0-37.9, adult: Secondary | ICD-10-CM

## 2021-03-08 DIAGNOSIS — Z23 Encounter for immunization: Secondary | ICD-10-CM

## 2021-03-08 MED ORDER — ATORVASTATIN CALCIUM 40 MG PO TABS: 40.0000 mg | ORAL_TABLET | Freq: Every day | ORAL | 3 refills | Status: DC

## 2021-03-08 MED ORDER — AMLODIPINE BESYLATE 2.5 MG PO TABS: 2.5000 mg | ORAL_TABLET | Freq: Every day | ORAL | 1 refills | Status: DC

## 2021-03-08 MED ORDER — LISINOPRIL 40 MG PO TABS
40.0000 mg | ORAL_TABLET | Freq: Every day | ORAL | 1 refills | Status: DC
Start: 2021-03-08 — End: 2021-08-23

## 2021-03-08 NOTE — Patient Instructions (Addendum)
Walking, parking further out at parking lots, taking the stairs is a way to incorporate exercise. Goal of 150 minutes per week.  Healthy snacks during the day.  I would consider meeting with nutritionist, medical or surgical weight loss specialist if you are having trouble losing weight with increased activity/exercise. Let me know if you want a referral.   Add low dose amlodipine once per day, continue lisinopril once per day.   Recheck 6 months, let me know if questions sooner. Take care.   Managing Your Hypertension Hypertension, also called high blood pressure, is when the force of the blood pressing against the walls of the arteries is too strong. Arteries are blood vessels that carry blood from your heart throughout your body. Hypertension forces the heart to work harder to pump blood and may cause the arteries to become narrow or stiff. Understanding blood pressure readings Your personal target blood pressure may vary depending on your medical conditions, your age, and other factors. A blood pressure reading includes a higher number over a lower number. Ideally, your blood pressure should be below 120/80. You should know that: The first, or top, number is called the systolic pressure. It is a measure of the pressure in your arteries as your heart beats. The second, or bottom number, is called the diastolic pressure. It is a measure of the pressure in your arteries as the heart relaxes. Blood pressure is classified into four stages. Based on your blood pressure reading, your health care provider may use the following stages to determine what type of treatment you need, if any. Systolic pressure and diastolic pressure are measured in a unit called mmHg. Normal Systolic pressure: below 161. Diastolic pressure: below 80. Elevated Systolic pressure: 096-045. Diastolic pressure: below 80. Hypertension stage 1 Systolic pressure: [id number]. Diastolic pressure: 91-47. Hypertension stage  2 Systolic pressure: 829 or above. Diastolic pressure: 90 or above. How can this condition affect me? Managing your hypertension is an important responsibility. Over time, hypertension can damage the arteries and decrease blood flow to important parts of the body, including the brain, heart, and kidneys. Having untreated or uncontrolled hypertension can lead to: A heart attack. A stroke. A weakened blood vessel (aneurysm). Heart failure. Kidney damage. Eye damage. Metabolic syndrome. Memory and concentration problems. Vascular dementia. What actions can I take to manage this condition? Hypertension can be managed by making lifestyle changes and possibly by taking medicines. Your health care provider will help you make a plan to bring your blood pressure within a normal range. Nutrition  Eat a diet that is high in fiber and potassium, and low in salt (sodium), added sugar, and fat. An example eating plan is called the Dietary Approaches to Stop Hypertension (DASH) diet. To eat this way: Eat plenty of fresh fruits and vegetables. Try to fill one-half of your plate at each meal with fruits and vegetables. Eat whole grains, such as whole-wheat pasta, brown rice, or whole-grain bread. Fill about one-fourth of your plate with whole grains. Eat low-fat dairy products. Avoid fatty cuts of meat, processed or cured meats, and poultry with skin. Fill about one-fourth of your plate with lean proteins such as fish, chicken without skin, beans, eggs, and tofu. Avoid pre-made and processed foods. These tend to be higher in sodium, added sugar, and fat. Reduce your daily sodium intake. Most people with hypertension should eat less than 1,500 mg of sodium a day. Lifestyle  Work with your health care provider to maintain a healthy body weight or  to lose weight. Ask what an ideal weight is for you. Get at least 30 minutes of exercise that causes your heart to beat faster (aerobic exercise) most days of the  week. Activities may include walking, swimming, or biking. Include exercise to strengthen your muscles (resistance exercise), such as weight lifting, as part of your weekly exercise routine. Try to do these types of exercises for 30 minutes at least 3 days a week. Do not use any products that contain nicotine or tobacco, such as cigarettes, e-cigarettes, and chewing tobacco. If you need help quitting, ask your health care provider. Control any long-term (chronic) conditions you have, such as high cholesterol or diabetes. Identify your sources of stress and find ways to manage stress. This may include meditation, deep breathing, or making time for fun activities. Alcohol use Do not drink alcohol if: Your health care provider tells you not to drink. You are pregnant, may be pregnant, or are planning to become pregnant. If you drink alcohol: Limit how much you use to: 0-1 drink a day for women. 0-2 drinks a day for men. Be aware of how much alcohol is in your drink. In the [place] one drink equals one 12 oz bottle of beer (355 mL), one 5 oz glass of wine (148 mL), or one 1 oz glass of hard liquor (44 mL). Medicines Your health care provider may prescribe medicine if lifestyle changes are not enough to get your blood pressure under control and if: Your systolic blood pressure is 130 or higher. Your diastolic blood pressure is 80 or higher. Take medicines only as told by your health care provider. Follow the directions carefully. Blood pressure medicines must be taken as told by your health care provider. The medicine does not work as well when you skip doses. Skipping doses also puts you at risk for problems. Monitoring Before you monitor your blood pressure: Do not smoke, drink caffeinated beverages, or exercise within 30 minutes before taking a measurement. Use the bathroom and empty your bladder (urinate). Sit quietly for at least 5 minutes before taking measurements. Monitor your blood  pressure at home as told by your health care provider. To do this: Sit with your back straight and supported. Place your feet flat on the floor. Do not cross your legs. Support your arm on a flat surface, such as a table. Make sure your upper arm is at heart level. Each time you measure, take two or three readings one minute apart and record the results. You may also need to have your blood pressure checked regularly by your health care provider. General information Talk with your health care provider about your diet, exercise habits, and other lifestyle factors that may be contributing to hypertension. Review all the medicines you take with your health care provider because there may be side effects or interactions. Keep all visits as told by your health care provider. Your health care provider can help you create and adjust your plan for managing your high blood pressure. Where to find more information National Heart, Lung, and Blood Institute: https://wilson-eaton.com/ American Heart Association: www.heart.org Contact a health care provider if: You think you are having a reaction to medicines you have taken. You have repeated (recurrent) headaches. You feel dizzy. You have swelling in your ankles. You have trouble with your vision. Get help right away if: You develop a severe headache or confusion. You have unusual weakness or numbness, or you feel faint. You have severe pain in your chest or abdomen. You  vomit repeatedly. You have trouble breathing. These symptoms may represent a serious problem that is an emergency. Do not wait to see if the symptoms will go away. Get medical help right away. Call your local emergency services (911 in the [place] Do not drive yourself to the hospital. Summary Hypertension is when the force of blood pumping through your arteries is too strong. If this condition is not controlled, it may put you at risk for serious complications. Your personal target blood  pressure may vary depending on your medical conditions, your age, and other factors. For most people, a normal blood pressure is less than 120/80. Hypertension is managed by lifestyle changes, medicines, or both. Lifestyle changes to help manage hypertension include losing weight, eating a healthy, low-sodium diet, exercising more, stopping smoking, and limiting alcohol. This information is not intended to replace advice given to you by your health care provider. Make sure you discuss any questions you have with your health care provider. Document Revised: 03/29/2019 Document Reviewed: 02/09/2019 Elsevier Patient Education  2022 [employer].    If you have lab work done today you will be contacted with your lab results within the next 2 weeks.  If you have not heard from [place] then please contact us. The fastest way to get your results is to register for My Chart.   IF you received an x-ray today, you will receive an invoice from [facility]. Please contact [facility] at [phone] with questions or concerns regarding your invoice.   IF you received labwork today, you will receive an invoice from [facility]. Please contact [facility] at [phone] with questions or concerns regarding your invoice.   Our billing staff will not be able to assist you with questions regarding bills from these companies.  You will be contacted with the lab results as soon as they are available. The fastest way to get your results is to activate your My Chart account. Instructions are located on the last page of this paperwork. If you have not heard from [place] regarding the results in 2 weeks, please contact this office.
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
## 2021-03-09 ENCOUNTER — Encounter: Payer: Self-pay | Admitting: Family Medicine

## 2021-03-09 LAB — COMPREHENSIVE METABOLIC PANEL
ALT: 21 U/L (ref 0–53)
AST: 15 U/L (ref 0–37)
Albumin: 4.2 g/dL (ref 3.5–5.2)
Alkaline Phosphatase: 71 U/L (ref 39–117)
BUN: 12 mg/dL (ref 6–23)
CO2: 27 mEq/L (ref 19–32)
Calcium: 9.2 mg/dL (ref 8.4–10.5)
Chloride: 103 mEq/L (ref 96–112)
Creatinine, Ser: 0.91 mg/dL (ref 0.40–1.50)
GFR: 94.3 mL/min (ref >60.00)
Glucose, Bld: 86 mg/dL (ref 70–99)
Potassium: 4 mEq/L (ref 3.5–5.1)
Sodium: 140 mEq/L (ref 135–145)
Total Bilirubin: 0.5 mg/dL (ref 0.2–1.2)
Total Protein: 6.7 g/dL (ref 6.0–8.3)

## 2021-06-06 ENCOUNTER — Telehealth: Payer: Self-pay | Admitting: Family Medicine

## 2021-06-06 NOTE — Telephone Encounter (Signed)
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

## 2021-06-06 NOTE — Telephone Encounter (Signed)
Pt dropped off a DOT exam letter. He would like this faxed to the # listed on the form and he would like to pick up a copy when completed.  ? ?I have attached a charge sheet.  ? ? ?
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
## 2021-06-13 NOTE — Telephone Encounter (Signed)
Pt is calling doing a follow up on paperwork that was dropped off last Wed stating he needed this to be able to start a new job  ?

## 2021-06-13 NOTE — Telephone Encounter (Signed)
Done - advised pt and placed in fax bin.  ?

## 2021-06-14 NOTE — Telephone Encounter (Addendum)
Patient states the company said Dr.Greene crossed out a section and now its not a valid form. Asking if someone can give them a call back at [phone] ?

## 2021-06-14 NOTE — Telephone Encounter (Signed)
faxed

## 2021-06-15 NOTE — Telephone Encounter (Signed)
Called and spoke to patient he is unsure what the issue is with the form will have to call the number provided on previous message  ?

## 2021-08-23 ENCOUNTER — Other Ambulatory Visit: Payer: Self-pay | Admitting: Family Medicine

## 2021-08-23 DIAGNOSIS — I10 Essential (primary) hypertension: Secondary | ICD-10-CM

## 2021-09-02 ENCOUNTER — Other Ambulatory Visit: Payer: Self-pay | Admitting: Family Medicine

## 2021-09-02 DIAGNOSIS — I10 Essential (primary) hypertension: Secondary | ICD-10-CM

## 2021-09-06 ENCOUNTER — Ambulatory Visit: Payer: 59 | Admitting: Family Medicine

## 2021-09-21 ENCOUNTER — Ambulatory Visit: Payer: 59 | Admitting: Family Medicine

## 2021-09-21 VITALS — BP 130/72 | HR 76 | Temp 98.1°F | Resp 16 | Ht 67.0 in | Wt 235.8 lb

## 2021-09-21 DIAGNOSIS — R42 Dizziness and giddiness: Secondary | ICD-10-CM

## 2021-09-21 DIAGNOSIS — I10 Essential (primary) hypertension: Secondary | ICD-10-CM

## 2021-09-21 DIAGNOSIS — R7303 Prediabetes: Secondary | ICD-10-CM | POA: Diagnosis not present

## 2021-09-21 DIAGNOSIS — R0789 Other chest pain: Secondary | ICD-10-CM

## 2021-09-21 DIAGNOSIS — E782 Mixed hyperlipidemia: Secondary | ICD-10-CM | POA: Diagnosis not present

## 2021-09-21 LAB — COMPREHENSIVE METABOLIC PANEL
ALT: 25 U/L (ref 0–53)
AST: 20 U/L (ref 0–37)
Albumin: 4.6 g/dL (ref 3.5–5.2)
Alkaline Phosphatase: 65 U/L (ref 39–117)
BUN: 15 mg/dL (ref 6–23)
CO2: 30 mEq/L (ref 19–32)
Calcium: 9.4 mg/dL (ref 8.4–10.5)
Chloride: 102 mEq/L (ref 96–112)
Creatinine, Ser: 0.84 mg/dL (ref 0.40–1.50)
GFR: 97.2 mL/min (ref >60.00)
Glucose, Bld: 102 mg/dL — ABNORMAL HIGH (ref 70–99)
Potassium: 3.9 mEq/L (ref 3.5–5.1)
Sodium: 139 mEq/L (ref 135–145)
Total Bilirubin: 0.8 mg/dL (ref 0.2–1.2)
Total Protein: 7.6 g/dL (ref 6.0–8.3)

## 2021-09-21 LAB — LIPID PANEL
Cholesterol: 194 mg/dL (ref 0–200)
HDL: 55 mg/dL (ref >39.00)
LDL Cholesterol: 104 mg/dL — ABNORMAL HIGH (ref 0–99)
NonHDL: 138.7
Total CHOL/HDL Ratio: 4
Triglycerides: 176 mg/dL — ABNORMAL HIGH (ref 0.0–149.0)
VLDL: 35.2 mg/dL (ref 0.0–40.0)

## 2021-09-21 LAB — CBC
HCT: 43.8 % (ref 39.0–52.0)
Hemoglobin: 14.8 g/dL (ref 13.0–17.0)
MCHC: 33.8 g/dL (ref 30.0–36.0)
MCV: 90.7 fl (ref 78.0–100.0)
Platelets: 257 10*3/uL (ref 150.0–400.0)
RBC: 4.82 Mil/uL (ref 4.22–5.81)
RDW: 14 % (ref 11.5–15.5)
WBC: 7.6 10*3/uL (ref 4.0–10.5)

## 2021-09-21 LAB — HEMOGLOBIN A1C: Hgb A1c MFr Bld: 6.6 % — ABNORMAL HIGH (ref 4.6–6.5)

## 2021-09-21 LAB — TSH: TSH: 1.75 u[IU]/mL (ref 0.35–5.50)

## 2021-09-21 NOTE — Progress Notes (Signed)
Subjective:  Patient ID: Bryce Skeen., male    DOB: Jun 09, 1965  Age: 56 y.o. MRN: [id number]  CC:  Chief Complaint  Patient presents with   Hypertension    Pt reports previous light headed spells when he initially started BP meds a few years ago and notes a few episodes that felt similar   Chest Pain    Pt notes some chest tightness, a few weeks ago has had some intermittent tightness without other sxs   Hyperlipidemia    Pt notes fasting - can do blood work so results are in for next visit     HPI Bryce Bush. presents for   Hypertension: Last discussed in December, elevated at that time.  Lisinopril dose was increased from 20 mg to 40 mg at his physical last July.  Amlodipine 2.5 mg added. Now on Amlodipine 2.5 mg daily, lisinopril 40 mg daily.   Not checking home BP's. No initial side effects.  Noticed lightheadedness a few weeks ago while watching TV.Room spinning feeling. No vomiting. Resolved in few seconds, no chest pains at that time or palpitations.  Room spinning feeling again 2 days ago. Driving on road at that time. Light symptoms, no blurry vision or weakness, no CP/palpitations. Had not eaten yet that day. Felt better after eating [employer] cup. Felt better in few minutes. Not eating right - no regular schedule, snacking, a lot of junk food. Drinking water and diet green tea.  Working midnight shift. Trying to figure out schedule - taking meds at 5 am some days and midnight other days.   Reports some chest tightness: Noticed 1 week ago. Left sided at rest. Lasted few minutes, resolved on own. No sweating, n/v, or dyspnea. No arm/neck/back radiation. No recurrence.  No CP with exertion. No heartburn.  Last stress test years ago - told was normal. No recent testing.  On asa 81 mg qd.  No melena/hematochezia.  Father, brother and grandfather with heart disease. Brother passed at age 5 - smoker. Father passed at age 38.   No tobacco.  Lab Results  Component Value  Date   HGBA1C 6.2 10/04/2020   Wt Readings from Last 3 Encounters:  09/21/21 235 lb 12.8 oz (107 kg)  03/08/21 236 lb 12.8 oz (107.4 kg)  02/20/21 235 lb (106.6 kg)   BP Readings from Last 3 Encounters:  09/21/21 130/72  03/08/21 (!) 138/92  02/20/21 (!) 146/89   Lab Results  Component Value Date   CREATININE 0.91 03/08/2021   Hyperlipidemia: Lipitor '40mg'$  qd for hld. No new side effects. No new myalgias. On fish oil  Lab Results  Component Value Date   CHOL 193 10/04/2020   HDL 52.50 10/04/2020   LDLCALC 102 (H) 10/04/2020   TRIG 192.0 (H) 10/04/2020   CHOLHDL 4 10/04/2020   Lab Results  Component Value Date   ALT 21 03/08/2021   AST 15 03/08/2021   ALKPHOS 71 03/08/2021   BILITOT 0.5 03/08/2021      History Patient Active Problem List   Diagnosis Date Noted   Class 1 obesity due to excess calories with serious comorbidity and body mass index (BMI) of 32.0 to 32.9 in adult 12/22/2016   HTN (hypertension) 03/11/2012   Hyperlipidemia 03/11/2012   Past Medical History:  Diagnosis Date   GERD (gastroesophageal reflux disease)    Hypercholesteremia    Hypertension    Past Surgical History:  Procedure Laterality Date   APPENDECTOMY     motor vechile  SPINE SURGERY     Cervical spine surgery   TONSILLECTOMY     No Known Allergies Prior to Admission medications   Medication Sig Start Date End Date Taking? Authorizing Provider  amLODipine (NORVASC) 2.5 MG tablet Take 1 tablet by mouth once daily 09/03/21  Yes Wendie Agreste, MD  aspirin 81 MG chewable tablet Chew 81 mg by mouth daily.   Yes [provider]  atorvastatin (LIPITOR) 40 MG tablet Take 1 tablet (40 mg total) by mouth daily. 03/08/21  Yes Wendie Agreste, MD  fish oil-omega-3 fatty acids 1000 MG capsule Take 1 g by mouth daily.   Yes [provider]  lisinopril (ZESTRIL) 40 MG tablet Take 1 tablet by mouth once daily 08/23/21  Yes Wendie Agreste, MD  LYSINE PO Take 1 tablet  by mouth daily.   Yes [provider]  omeprazole (PRILOSEC) 40 MG capsule Take 1 capsule (40 mg total) by mouth daily. 01/10/21  Yes Milus Banister, MD   Social History   Socioeconomic History   Marital status: Single    Spouse name: Not on file   Number of children: 2   Years of education: Not on file   Highest education level: Not on file  Occupational History   Occupation: truck driver    Comment: [employer]  Tobacco Use   Smoking status: Never   Smokeless tobacco: Never  Substance and Sexual Activity   Alcohol use: No   Drug use: No   Sexual activity: Yes  Other Topics Concern   Not on file  Social History Narrative   Marital status: Single; not dating      Children: 2 children/adopted.  No grandchildren.      Education: [employer].      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      Tobacco: none      Alcohol: none      Drugs: none      Exercise:  Weight room in house; lifts weights.      Seatbelt: 100%; no texting      Sexual activity: none in past year; no STDs.  Females only.           Social Determinants of Health   Financial Resource Strain: Not on file  Food Insecurity: Not on file  Transportation Needs: Not on file  Physical Activity: Not on file  Stress: Not on file  Social Connections: Not on file  Intimate Partner Violence: Not on file    Review of Systems Per HPI.   Objective:   Vitals:   09/21/21 1109  BP: 130/72  Pulse: 76  Resp: 16  Temp: 98.1 F (36.7 C)  TempSrc: Oral  SpO2: 96%  Weight: 235 lb 12.8 oz (107 kg)  Height: '5\' 7"'$  (1.702 m)     Physical Exam Vitals reviewed.  Constitutional:      Appearance: He is well-developed.  HENT:     Head: Normocephalic and atraumatic.  Neck:     Vascular: No carotid bruit or JVD.  Cardiovascular:     Rate and Rhythm: Normal rate and regular rhythm.     Heart sounds: Normal heart sounds. No murmur heard. Pulmonary:     Effort:  Pulmonary effort is normal.     Breath sounds: Normal breath sounds. No rales.  Musculoskeletal:     Right lower leg: No edema.     Left lower leg: No  edema.  Skin:    General: Skin is warm and dry.  Neurological:     Mental Status: He is alert and oriented to person, place, and time.  Psychiatric:        Mood and Affect: Mood normal.     EKG, sinus rhythm, rate 69.  QT C407, no apparent acute ST or T wave changes, no apparent changes from 08/13/2017 comparison.  Assessment & Plan:  Bryce Bush. is a 56 y.o. male . Chest tightness - Plan: EKG 12-Lead, Ambulatory referral to Cardiology   -Fleeting episode as above, atypical, nonexertional.  Cardiac risk factors of hypertension, hyperlipidemia, prediabetes, family history.  No acute findings on EKG, asymptomatic at present.  Refer to cardiology, cautioned about driving given these prior symptoms, close cardiology follow-up planned.  911/ER precautions if any recurrence of chest pain.  Essential hypertension Episodic lightheadedness - Plan: Ambulatory referral to Cardiology  Dizziness - Plan: Ambulatory referral to Cardiology, Comprehensive metabolic panel, CBC, TSH  -Stable on current regimen, few episodes of lightheadedness could have been related to pushing calories versus overlap blood pressure medication given variable timing.  Consistent of doses discussed, regular meals and snacks discussed.  If symptoms return advised to be seen immediately and should not drive.  Check labs as above.  ER/911 precautions given.  Prediabetes - Plan: Hemoglobin A1c  -Discussed diet.  Check updated A1c.  Mixed hyperlipidemia - Plan: Lipid panel Updated labs ordered.  Tolerating Lipitor, medication adjustment based on results.  No orders of the defined types were placed in this encounter.  Patient Instructions  I will check labs today. Make sure you have 3 meals per day and healthy snacks in between. Try to avoid caffeine - water is best and  goal of 64 ounces of water per day. If any further dizziness,do not drive.  Make sure to take blood pressure meds same time every day. Keep a record of your blood pressures outside of the office and bring them to the next office visit. I will refer you to cardiology for the chest tightness episode you experienced last week.  If any return of chest pain be seen in the ER.     Signed,   [name], MD [place] 09/21/21 11:54 AM

## 2021-09-21 NOTE — Patient Instructions (Addendum)
I will check labs today. Make sure you have 3 meals per day and healthy snacks in between. Try to avoid caffeine - water is best and goal of 64 ounces of water per day. If any further dizziness,do not drive.  Make sure to take blood pressure meds same time every day. Keep a record of your blood pressures outside of the office and bring them to the next office visit. I will refer you to cardiology for the chest tightness episode you experienced last week.  If any return of chest pain be seen in the ER.

## 2021-10-16 ENCOUNTER — Ambulatory Visit (HOSPITAL_BASED_OUTPATIENT_CLINIC_OR_DEPARTMENT_OTHER): Payer: 59 | Admitting: Cardiology

## 2021-10-16 ENCOUNTER — Encounter (HOSPITAL_BASED_OUTPATIENT_CLINIC_OR_DEPARTMENT_OTHER): Payer: Self-pay | Admitting: Cardiology

## 2021-10-16 VITALS — BP 145/92 | HR 69 | Ht 68.0 in | Wt 228.4 lb

## 2021-10-16 DIAGNOSIS — R079 Chest pain, unspecified: Secondary | ICD-10-CM

## 2021-10-16 DIAGNOSIS — I10 Essential (primary) hypertension: Secondary | ICD-10-CM

## 2021-10-16 DIAGNOSIS — E8881 Metabolic syndrome: Secondary | ICD-10-CM

## 2021-10-16 DIAGNOSIS — R42 Dizziness and giddiness: Secondary | ICD-10-CM

## 2021-10-16 DIAGNOSIS — E1169 Type 2 diabetes mellitus with other specified complication: Secondary | ICD-10-CM | POA: Insufficient documentation

## 2021-10-16 DIAGNOSIS — Z01812 Encounter for preprocedural laboratory examination: Secondary | ICD-10-CM

## 2021-10-16 DIAGNOSIS — Z8249 Family history of ischemic heart disease and other diseases of the circulatory system: Secondary | ICD-10-CM

## 2021-10-16 DIAGNOSIS — Z7189 Other specified counseling: Secondary | ICD-10-CM

## 2021-10-16 DIAGNOSIS — E669 Obesity, unspecified: Secondary | ICD-10-CM

## 2021-10-16 DIAGNOSIS — E782 Mixed hyperlipidemia: Secondary | ICD-10-CM

## 2021-10-16 DIAGNOSIS — R072 Precordial pain: Secondary | ICD-10-CM

## 2021-10-16 HISTORY — DX: Metabolic syndrome: E88.810

## 2021-10-16 HISTORY — DX: Family history of ischemic heart disease and other diseases of the circulatory system: Z82.49

## 2021-10-16 HISTORY — DX: Type 2 diabetes mellitus with other specified complication: E11.69

## 2021-10-16 HISTORY — DX: Type 2 diabetes mellitus with other specified complication: E66.9

## 2021-10-16 HISTORY — DX: Metabolic syndrome: E88.81

## 2021-10-16 MED ORDER — METOPROLOL TARTRATE 50 MG PO TABS: ORAL_TABLET | ORAL | 0 refills | Status: DC

## 2021-10-16 NOTE — Patient Instructions (Addendum)
Medication Instructions:  Your Physician recommend you continue on your current medication as directed.    *If you need a refill on your cardiac medications before your next appointment, please call your pharmacy*   Lab Work: Your provider has recommended lab work today (BMP). Please have this collected at [facility] at [place]. The lab is open 8:00 am - 4:30 pm. Please avoid 12:00p - 1:00p for lunch hour. You do not need an appointment. Please go to [address] [place], [place] [address]. This is in the Primary Care office on the 3rd floor, let them know you are there for blood work and they will direct you to the lab.  If you have labs (blood work) drawn today and your tests are completely normal, you will receive your results only by: [place] (if you have MyChart) OR A paper copy in the mail If you have any lab test that is abnormal or we need to change your treatment, we will call you to review the results.   Testing/Procedures: Cardiac CT Angiography (CTA), is a special type of CT scan that uses a computer to produce multi-dimensional views of major blood vessels throughout the body. In CT angiography, a contrast material is injected through an IV to help visualize the blood vessels  [facility]  Follow-Up: At [facility], you and your health needs are our priority.  As part of our continuing mission to provide you with exceptional heart care, we have created designated Provider Care Teams.  These Care Teams include your primary Cardiologist (physician) and Advanced Practice Providers (APPs -  Physician Assistants and Nurse Practitioners) who all work together to provide you with the care you need, when you need it.  We recommend signing up for the patient portal called "MyChart".  Sign up information is provided on this After Visit Summary.  MyChart is used to connect with patients for Virtual Visits (Telemedicine).  Patients are able to  view lab/test results, encounter notes, upcoming appointments, etc.  Non-urgent messages can be sent to your provider as well.   To learn more about what you can do with MyChart, go to [email or web address].    Your next appointment:   6-8 week(s)  The format for your next appointment:   In Person  Provider:   Buford Dresser, MD{    Your cardiac CT will be scheduled at one of the below locations:   [facility] [address] [place], [place] [address] [phone]  If scheduled at [facility], please arrive at the [facility] and Children's Entrance (Entrance C2) of [facility] 30 minutes prior to test start time. You can use the FREE valet parking offered at entrance C (encouraged to control the heart rate for the test)  Proceed to the [facility] Radiology Department (first floor) to check-in and test prep.  All radiology patients and guests should use entrance C2 at [facility], accessed from [facility], even though the hospital's physical address listed is [address].    If scheduled at [facility], please arrive 15 mins early for check-in and test prep.  Please follow these instructions carefully (unless otherwise directed):  Hold all erectile dysfunction medications at least 3 days (72 hrs) prior to test.  On the Night Before the Test: Be sure to Drink plenty of water. Do not consume any caffeinated/decaffeinated beverages or chocolate 12 hours prior to your test. Do not take any antihistamines  12 hours prior to your test.  On the Day of the Test: Drink plenty of water until 1 hour prior to the test. Do not eat any food 4 hours prior to the test. You may take your regular medications prior to the test.  Take metoprolol (Lopressor) 50 mg  two hours prior to test.       After the Test: Drink plenty of water. After receiving IV contrast, you may experience a mild flushed  feeling. This is normal. On occasion, you may experience a mild rash up to 24 hours after the test. This is not dangerous. If this occurs, you can take Benadryl 25 mg and increase your fluid intake. If you experience trouble breathing, this can be serious. If it is severe call 911 IMMEDIATELY. If it is mild, please call our office. If you take any of these medications: Glipizide/Metformin, Avandament, Glucavance, please do not take 48 hours after completing test unless otherwise instructed.  We will call to schedule your test 2-4 weeks out understanding that some insurance companies will need an authorization prior to the service being performed.   For non-scheduling related questions, please contact the cardiac imaging nurse navigator should you have any questions/concerns: Marchia Bond, Cardiac Imaging Nurse Navigator [name], Cardiac Imaging Nurse Navigator [facility] Direct Office Dial: [phone]   For scheduling needs, including cancellations and rescheduling, please call [place], [phone].

## 2021-10-16 NOTE — Progress Notes (Signed)
Cardiology Office Note:    Date:  10/16/2021   ID:  Bryce Skeen., DOB 25-Dec-1965, MRN [id number]  PCP:  Bryce Agreste, MD  Cardiologist:  Bryce Dresser, MD  Referring MD: Bryce Agreste, MD   CC: new patient consultation for chest tightness and episodic dizziness/lightheadedness  History of Present Illness:    Bryce Bush. is a 56 y.o. male with a hx of hypertension, hyperlipidemia, and GERD, who is seen as a new consult at the request of Bryce Agreste, MD for the evaluation and management of chest tightness and episodic dizziness/lightheadedness.  On 09/21/21 he was seen by his PCP Dr. Carlota Bush. He reported room spinning lightheadedness a few weeks prior while watching TV, which recurred 2 days prior to his appointment while driving on the road. He also noted chest tightness one week prior without recurrence. He has a family history of heart disease in his father, brother, and grandfather.  Chest pain:  -Initial onset: One week prior to PCP visit 09/21/21. -Quality: "Felt a little tighter." When he did experience his dizziness, he was sitting down, and it only lasted a second. At the time his BP was a little high. -Frequency: Isolated incident. No recurring dizziness or chest pain since his dietary changes. Was drinking tea and diet tea, hadn't had a soft drink for 10 years. Currently he is only drinking water, and has avoided caffeine and sweets. Snacks may be strawberries, grapes, sugar free whipped cream. Per his scale at home he has lost 10 lbs. -Duration: Brief. -Prior ECG: 09/21/21 per PCP - sinus rhythm, rate 69.  QT C407, no apparent acute ST or T wave changes, no apparent changes from 08/13/2017 comparison. -Prior workup:  Stress test 17 years ago, reportedly normal. -Alcohol: Never. -Tobacco:  Never. -Comorbidities: Hypertension - On amlodipine, lisinopril. At home his blood pressure has ranged 161/93 at the worst, and 143/87 at best. About 14-15 years ago he  had a severe dizzy spell when he began antihypertensives. Hyperlipidemia, GERD. Per his recent labs personally reviewed, he is technically diabetic. -Exercise level: Currently working in a new job, leaves for work at midnight. -Cardiac ROS: no shortness of breath, no PND, no orthopnea, no LE edema, no syncope -Family history: His grandfather (63 yo), father (32 yo), and brother (60 yo) all died of a heart attack. His sister who is 2 years younger had 3 stents placed.   A year ago he experienced occasional heart flutters. None currently.  He denies any shortness of breath, or peripheral edema. No headaches, syncope, orthopnea, or PND.  Past Medical History:  Diagnosis Date   GERD (gastroesophageal reflux disease)    Hypercholesteremia    Hypertension     Past Surgical History:  Procedure Laterality Date   APPENDECTOMY     motor vechile     SPINE SURGERY     Cervical spine surgery   TONSILLECTOMY      Current Medications: Current Outpatient Medications on File Prior to Visit  Medication Sig   amLODipine (NORVASC) 2.5 MG tablet Take 1 tablet by mouth once daily   aspirin 81 MG chewable tablet Chew 81 mg by mouth daily.   atorvastatin (LIPITOR) 40 MG tablet Take 1 tablet (40 mg total) by mouth daily.   fish oil-omega-3 fatty acids 1000 MG capsule Take 1 g by mouth daily.   lisinopril (ZESTRIL) 40 MG tablet Take 1 tablet by mouth once daily   LYSINE PO Take 1 tablet by mouth daily.  omeprazole (PRILOSEC) 40 MG capsule Take 1 capsule (40 mg total) by mouth daily.   No current facility-administered medications on file prior to visit.     Allergies:   Patient has no known allergies.   Social History   Tobacco Use   Smoking status: Never   Smokeless tobacco: Never  Substance Use Topics   Alcohol use: No   Drug use: No    Family History: family history includes Alcohol abuse in his brother; COPD in his brother; Cancer (age of onset: 50) in his father; Diabetes in his  paternal grandmother; Bryce Bush' disease in his brother; Heart disease (age of onset: 81) in his father; Hyperlipidemia in his mother; Hypertension in his mother. There is no history of Colon cancer, Colon polyps, Esophageal cancer, Rectal cancer, or Stomach cancer.  ROS:   Please see the history of present illness.  Additional pertinent ROS: Constitutional: Negative for chills, fever, night sweats, unintentional weight loss  HENT: Negative for ear pain and hearing loss.   Eyes: Negative for loss of vision and eye pain.  Respiratory: Negative for cough, sputum, wheezing.   Cardiovascular: See HPI. Gastrointestinal: Negative for abdominal pain, melena, and hematochezia.  Genitourinary: Negative for dysuria and hematuria.  Musculoskeletal: Negative for falls and myalgias.  Skin: Negative for itching and rash.  Neurological: Negative for focal weakness, focal sensory changes and loss of consciousness.  Endo/Heme/Allergies: Does not bruise/bleed easily.     EKGs/Labs/Other Studies Reviewed:    The following studies were reviewed today:  No prior cardiovascular studies available.   EKG:  EKG is personally reviewed.   10/16/2021:  NSR at 69 bpm, with nonspecific t wave pattern  Recent Labs: 09/21/2021: ALT 25; BUN 15; Creatinine, Ser 0.84; Hemoglobin 14.8; Platelets 257.0; Potassium 3.9; Sodium 139; TSH 1.75   Recent Lipid Panel    Component Value Date/Time   CHOL 194 09/21/2021 1227   CHOL 143 11/18/2019 1422   TRIG 176.0 (H) 09/21/2021 1227   HDL 55.00 09/21/2021 1227   HDL 50 11/18/2019 1422   CHOLHDL 4 09/21/2021 1227   VLDL 35.2 09/21/2021 1227   LDLCALC 104 (H) 09/21/2021 1227   LDLCALC 75 11/18/2019 1422    Physical Exam:    VS:  BP (!) 145/92 (BP Location: Left Arm, Patient Position: Sitting, Cuff Size: Large)   Pulse 69   Ht '5\' 8"'$  (1.727 m)   Wt 228 lb 6.4 oz (103.6 kg)   BMI 34.73 kg/m     Orthostatic vitals: Lying 146/94, HR 67 Sitting 143/93, HR 75 Standing  148/99, HR 80 Standing 3 min 148/93, HR 80  Wt Readings from Last 3 Encounters:  10/16/21 228 lb 6.4 oz (103.6 kg)  09/21/21 235 lb 12.8 oz (107 kg)  03/08/21 236 lb 12.8 oz (107.4 kg)    GEN: Well nourished, well developed in no acute distress HEENT: Normal, moist mucous membranes NECK: No JVD CARDIAC: regular rhythm, normal S1 and S2, no rubs or gallops. No murmur. VASCULAR: Radial and DP pulses 2+ bilaterally. No carotid bruits RESPIRATORY:  Clear to auscultation without rales, wheezing or rhonchi  ABDOMEN: Soft, non-tender, non-distended MUSCULOSKELETAL:  Ambulates independently SKIN: Warm and dry, no edema NEUROLOGIC:  Alert and oriented x 3. No focal neuro deficits noted. PSYCHIATRIC:  Normal affect    ASSESSMENT:    1. Chest pain of uncertain etiology   2. Essential hypertension   3. Mixed hyperlipidemia   4. Family history of heart disease   5. Cardiac risk counseling  6. Counseling on health promotion and disease prevention   7. Vertigo   8. Diabetes mellitus type 2 in obese ([place])   9. Metabolic syndrome    PLAN:    Chest pain -discussed treadmill stress, nuclear stress/lexiscan, and CT coronary angiography. Discussed pros and cons of each, including but not limited to false positive/false negative risk, radiation risk, and risk of IV contrast dye. Based on shared decision making, decision was made to pursue CT coronary angiography. -will give one time dose of metoprolol 2 hours prior to scheduled test -counseled on need to get BMET prior to test -counseled on use of sublingual nitroglycerin and its importance to a good test  -reviewed red flag warning signs that need immediate medical attention  Risk factors, including Family history of heart disease Metabolic syndrome, with Hypertension, Mixed hyperlipidemia, Type II diabetes, obesity (BMI 34) -continue amlodipine, aspirin, atorvastatin, lisinopril  Dizziness/lightheadedness -orthostatics negative -history  suggests possible vertigo  Cardiac risk counseling and prevention recommendations: -recommend heart healthy/Mediterranean diet, with whole grains, fruits, vegetable, fish, lean meats, nuts, and olive oil. Limit salt. -recommend moderate walking, 3-5 times/week for 30-50 minutes each session. Aim for at least 150 minutes.week. Goal should be pace of 3 miles/hours, or walking 1.5 miles in 30 minutes -recommend avoidance of tobacco products. Avoid excess alcohol. -ASCVD risk score: The 10-year ASCVD risk score (Arnett DK, et al., 2019) is: 14.7%   Values used to calculate the score:     Age: 59 years     Sex: Male     Is Non-Hispanic African American: No     Diabetic: Yes     Tobacco smoker: No     Systolic Blood Pressure: 509 mmHg     Is BP treated: Yes     HDL Cholesterol: 55 mg/dL     Total Cholesterol: 194 mg/dL    Plan for follow up: 6-8 weeks or sooner as needed.  Bryce Dresser, MD, PhD, [facility]    Medication Adjustments/Labs and Tests Ordered: Current medicines are reviewed at length with the patient today.  Concerns regarding medicines are outlined above.   Orders Placed This Encounter  Procedures   CT CORONARY MORPH W/CTA COR W/SCORE W/CA W/CM &/OR WO/CM   Basic metabolic panel   EKG 32-IZTI   Meds ordered this encounter  Medications   DISCONTD: metoprolol tartrate (LOPRESSOR) 50 MG tablet    Sig: TAKE 1 TABLET 2 HR PRIOR TO CARDIAC PROCEDURE    Dispense:  1 tablet    Refill:  0   Patient Instructions  Medication Instructions:  Your Physician recommend you continue on your current medication as directed.    *If you need a refill on your cardiac medications before your next appointment, please call your pharmacy*   Lab Work: Your provider has recommended lab work today (BMP). Please have this collected at [facility]. The lab is open 8:00 am - 4:30 pm. Please avoid 12:00p - 1:00p for lunch hour. You do not  need an appointment. Please go to [address] [place], [place] [address]. This is in the Primary Care office on the 3rd floor, let them know you are there for blood work and they will direct you to the lab.  If you have labs (blood work) drawn today and your tests are completely normal, you will receive your results only by: [place] (if you have MyChart) OR A paper copy in the mail If you have any  lab test that is abnormal or we need to change your treatment, we will call you to review the results.   Testing/Procedures: Cardiac CT Angiography (CTA), is a special type of CT scan that uses a computer to produce multi-dimensional views of major blood vessels throughout the body. In CT angiography, a contrast material is injected through an IV to help visualize the blood vessels  [facility]  Follow-Up: At [facility], you and your health needs are our priority.  As part of our continuing mission to provide you with exceptional heart care, we have created designated Provider Care Teams.  These Care Teams include your primary Cardiologist (physician) and Advanced Practice Providers (APPs -  Physician Assistants and Nurse Practitioners) who all work together to provide you with the care you need, when you need it.  We recommend signing up for the patient portal called "MyChart".  Sign up information is provided on this After Visit Summary.  MyChart is used to connect with patients for Virtual Visits (Telemedicine).  Patients are able to view lab/test results, encounter notes, upcoming appointments, etc.  Non-urgent messages can be sent to your provider as well.   To learn more about what you can do with MyChart, go to [email or web address].    Your next appointment:   6-8 week(s)  The format for your next appointment:   In Person  Provider:   Buford Dresser, MD{    Your cardiac CT will be scheduled at one of the below locations:   [facility] [address] [place], [place] [address] [phone]  If scheduled at [facility], please arrive at the [facility] and Children's Entrance (Entrance C2) of [facility] 30 minutes prior to test start time. You can use the FREE valet parking offered at entrance C (encouraged to control the heart rate for the test)  Proceed to the [facility] Radiology Department (first floor) to check-in and test prep.  All radiology patients and guests should use entrance C2 at [facility], accessed from [facility], even though the hospital's physical address listed is [address]    If scheduled at [facility], please arrive 15 mins early for check-in and test prep.  Please follow these instructions carefully (unless otherwise directed):  Hold all erectile dysfunction medications at least 3 days (72 hrs) prior to test.  On the Night Before the Test: Be sure to Drink plenty of water. Do not consume any caffeinated/decaffeinated beverages or chocolate 12 hours prior to your test. Do not take any antihistamines 12 hours prior to your test.  On the Day of the Test: Drink plenty of water until 1 hour prior to the test. Do not eat any food 4 hours prior to the test. You may take your regular medications prior to the test.  Take metoprolol (Lopressor) 50 mg  two hours prior to test.       After the Test: Drink plenty of water. After receiving IV contrast, you may experience a mild flushed feeling. This is normal. On occasion, you may experience a mild rash up to 24 hours after the test. This is not dangerous. If this occurs, you can take Benadryl 25 mg and increase your fluid intake. If you experience trouble breathing, this can be serious. If it is severe call 911 IMMEDIATELY. If it is mild, please call our office. If you take any of these medications: Glipizide/Metformin, Avandament, Glucavance, please do not take  48  hours after completing test unless otherwise instructed.  We will call to schedule your test 2-4 weeks out understanding that some insurance companies will need an authorization prior to the service being performed.   For non-scheduling related questions, please contact the cardiac imaging nurse navigator should you have any questions/concerns: Marchia Bond, Cardiac Imaging Nurse Navigator [name], Cardiac Imaging Nurse Navigator [facility] Direct Office Dial: [phone]   For scheduling needs, including cancellations and rescheduling, please call [place], [phone].          I,Mathew Stumpf,acting as a Education administrator for [employer], MD.,have documented all relevant documentation on the behalf of Bryce Dresser, MD,as directed by  Bryce Dresser, MD while in the presence of Bryce Dresser, MD.  I, Bryce Dresser, MD, have reviewed all documentation for this visit. The documentation on 11/27/21 for the exam, diagnosis, procedures, and orders are all accurate and complete.   Signed, Bryce Dresser, MD PhD 10/16/2021     [place]

## 2021-10-17 LAB — BASIC METABOLIC PANEL
BUN/Creatinine Ratio: 20 (ref 9–20)
BUN: 18 mg/dL (ref 6–24)
CO2: 23 mmol/L (ref 20–29)
Calcium: 9.5 mg/dL (ref 8.7–10.2)
Chloride: 103 mmol/L (ref 96–106)
Creatinine, Ser: 0.92 mg/dL (ref 0.76–1.27)
Glucose: 104 mg/dL — ABNORMAL HIGH (ref 70–99)
Potassium: 4.9 mmol/L (ref 3.5–5.2)
Sodium: 140 mmol/L (ref 134–144)
eGFR: 98 mL/min/{1.73_m2} (ref >59)

## 2021-10-31 ENCOUNTER — Telehealth: Payer: Self-pay | Admitting: Cardiology

## 2021-10-31 NOTE — Telephone Encounter (Signed)
Returned call to patient to discuss upcoming appointment, patient endorses that he lost his best friend yesterday. He called in to state that he received a message from [employer] that he would have to pay $1400 upfront for his Cardiac CT on august 15th. Patient states he was on the phone with his insurance and they told him that they had not received a precert from [place].   Routing to precert for follow up, please call patient and let him know the insurance outcome.

## 2021-10-31 NOTE — Telephone Encounter (Signed)
Pt would like a callback regarding appt. On 11/06/21. Please advise

## 2021-11-02 ENCOUNTER — Encounter (HOSPITAL_COMMUNITY): Payer: Self-pay

## 2021-11-05 ENCOUNTER — Encounter (HOSPITAL_BASED_OUTPATIENT_CLINIC_OR_DEPARTMENT_OTHER): Payer: Self-pay | Admitting: *Deleted

## 2021-11-05 ENCOUNTER — Telehealth (HOSPITAL_COMMUNITY): Payer: Self-pay | Admitting: *Deleted

## 2021-11-05 NOTE — Telephone Encounter (Signed)
Reaching out to patient to offer assistance regarding upcoming cardiac imaging study; pt verbalizes understanding of appt date/time, parking situation and where to check in, pre-test NPO status and medications ordered, and verified current allergies; name and call back number provided for further questions should they arise  Bryce Clement RN Navigator Cardiac [place] [facility] [phone] office [phone] cell  Patient to take '50mg'$  metoprolol tartrate TWO hours prior to his cardiac CT scan.  He is aware to arrive at 10:30am.

## 2021-11-06 ENCOUNTER — Ambulatory Visit (HOSPITAL_COMMUNITY)
Admission: RE | Admit: 2021-11-06 | Discharge: 2021-11-06 | Disposition: A | Discharge status: Home / Self Care | Payer: 59 | Source: Ambulatory Visit | Attending: Cardiology | Admitting: Cardiology

## 2021-11-06 DIAGNOSIS — R072 Precordial pain: Secondary | ICD-10-CM | POA: Insufficient documentation

## 2021-11-06 DIAGNOSIS — R079 Chest pain, unspecified: Secondary | ICD-10-CM | POA: Diagnosis present

## 2021-11-06 MED ORDER — IOHEXOL 350 MG/ML SOLN
100.0000 mL | Freq: Once | INTRAVENOUS | Status: AC | PRN
  Administered 2021-11-06: 100 mL via INTRAVENOUS

## 2021-11-06 MED ORDER — NITROGLYCERIN 0.4 MG SL SUBL
SUBLINGUAL_TABLET | SUBLINGUAL | Status: AC
  Filled 2021-11-06: qty 2

## 2021-11-06 MED ORDER — NITROGLYCERIN 0.4 MG SL SUBL: 0.8000 mg | SUBLINGUAL_TABLET | Freq: Once | SUBLINGUAL | Status: DC

## 2021-11-06 MED ORDER — NITROGLYCERIN 0.4 MG SL SUBL
0.8000 mg | SUBLINGUAL_TABLET | Freq: Once | SUBLINGUAL | Status: AC
  Administered 2021-11-06: 0.8 mg via SUBLINGUAL

## 2021-11-12 ENCOUNTER — Encounter (HOSPITAL_BASED_OUTPATIENT_CLINIC_OR_DEPARTMENT_OTHER): Payer: Self-pay | Admitting: Cardiology

## 2021-11-12 ENCOUNTER — Ambulatory Visit (HOSPITAL_BASED_OUTPATIENT_CLINIC_OR_DEPARTMENT_OTHER): Payer: 59 | Admitting: Cardiology

## 2021-11-12 VITALS — BP 146/94 | HR 68 | Ht 68.0 in | Wt 229.0 lb

## 2021-11-12 DIAGNOSIS — Z712 Person consulting for explanation of examination or test findings: Secondary | ICD-10-CM

## 2021-11-12 DIAGNOSIS — I10 Essential (primary) hypertension: Secondary | ICD-10-CM

## 2021-11-12 DIAGNOSIS — E1169 Type 2 diabetes mellitus with other specified complication: Secondary | ICD-10-CM

## 2021-11-12 DIAGNOSIS — Z8249 Family history of ischemic heart disease and other diseases of the circulatory system: Secondary | ICD-10-CM

## 2021-11-12 DIAGNOSIS — E8881 Metabolic syndrome: Secondary | ICD-10-CM

## 2021-11-12 DIAGNOSIS — Z7189 Other specified counseling: Secondary | ICD-10-CM

## 2021-11-12 DIAGNOSIS — E782 Mixed hyperlipidemia: Secondary | ICD-10-CM | POA: Diagnosis not present

## 2021-11-12 DIAGNOSIS — E669 Obesity, unspecified: Secondary | ICD-10-CM

## 2021-11-12 NOTE — Patient Instructions (Signed)

## 2021-11-12 NOTE — Progress Notes (Signed)
Cardiology Office Note:    Date:  11/12/2021   ID:  [name]., DOB [birth year], MRN [id number]  PCP:  Wendie Agreste, MD  Cardiologist:  Buford Dresser, MD  Referring MD: Wendie Agreste, MD   CC: follow up  History of Present Illness:    Bryce Bush. is a 56 y.o. male with a hx of hypertension, hyperlipidemia, and GERD, who is seen for follow-up. He was initially seen 10/16/2021 as a new consult at the request of Wendie Agreste, MD for the evaluation and management of chest tightness and episodic dizziness/lightheadedness.  On 09/21/21 he was seen by his PCP Dr. Carlota Raspberry. He reported room spinning lightheadedness a few weeks prior while watching TV, which recurred 2 days prior to his appointment while driving on the road. He also noted chest tightness one week prior without recurrence. He has a family history of heart disease in his father, brother, and grandfather.  Comorbidities: Hypertension - On amlodipine, lisinopril. At home his blood pressure has ranged 161/93 at the worst, and 143/87 at best. About 14-15 years ago he had a severe dizzy spell when he began antihypertensives. Hyperlipidemia, GERD. Per his recent labs personally reviewed, he is technically diabetic.  Family history: His grandfather (44 yo), father (59 yo), and brother (63 yo) all died of a heart attack. His sister who is 2 years younger had 3 stents placed.   Today: He has been feeling terrible. He is grieving the loss of his best friend who passed away 2 weeks ago.   His blood pressure is elevated in clinic today at 140/84, and 146/94 on recheck. At home he usually sees diastolic readings closer to 95.  Also he complains of occasional headaches. He asks if this is a possible side effect of atorvastatin. He notes having prior skull fractures and hardware placement as well.  At home this morning his weight was 224 lbs. Previously he has tried to lose weight with multiple diet plans, usually with  limited to no success. Lately he has done well with eating more salads and avoiding junk foods. His A1C was 6.6 as of 09/21/21.  We reviewed the results of his CT scan in detail.  He denies any palpitations, chest pain, shortness of breath, or peripheral edema. No lightheadedness, syncope, orthopnea, or PND.   Past Medical History:  Diagnosis Date   Class 1 obesity due to excess calories with serious comorbidity and body mass index (BMI) of 32.0 to 32.9 in adult 12/22/2016   Diabetes mellitus type 2 in obese ([place]) 10/16/2021   Essential hypertension 03/11/2012   Family history of heart disease 10/16/2021   GERD (gastroesophageal reflux disease)    Hypercholesteremia    Hypertension    Metabolic syndrome 06/07/4006   Mixed hyperlipidemia 03/11/2012    Past Surgical History:  Procedure Laterality Date   APPENDECTOMY     motor vechile     SPINE SURGERY     Cervical spine surgery   TONSILLECTOMY      Current Medications: Current Outpatient Medications on File Prior to Visit  Medication Sig   amLODipine (NORVASC) 2.5 MG tablet Take 1 tablet by mouth once daily   aspirin 81 MG chewable tablet Chew 81 mg by mouth daily.   atorvastatin (LIPITOR) 40 MG tablet Take 1 tablet (40 mg total) by mouth daily.   fish oil-omega-3 fatty acids 1000 MG capsule Take 1 g by mouth daily.   lisinopril (ZESTRIL) 40 MG tablet Take 1 tablet by mouth  once daily   LYSINE PO Take 1 tablet by mouth daily.   omeprazole (PRILOSEC) 40 MG capsule Take 1 capsule (40 mg total) by mouth daily.   No current facility-administered medications on file prior to visit.     Allergies:   Patient has no known allergies.   Social History   Tobacco Use   Smoking status: Never   Smokeless tobacco: Never  Substance Use Topics   Alcohol use: No   Drug use: No    Family History: family history includes Alcohol abuse in his brother; COPD in his brother; Cancer (age of onset: 42) in his father; Diabetes in his paternal  grandmother; Berenice Primas' disease in his brother; Heart disease (age of onset: 94) in his father; Hyperlipidemia in his mother; Hypertension in his mother. There is no history of Colon cancer, Colon polyps, Esophageal cancer, Rectal cancer, or Stomach cancer.  ROS:   Please see the history of present illness. (+) Grief (+) Headaches All other systems are reviewed and negative.    EKGs/Labs/Other Studies Reviewed:    The following studies were reviewed today:  Coronary CTA  11/06/2021: FINDINGS: Image quality: Average.   Noise artifact is: Moderate. (signal-to-noise/mis-registration).   Coronary Arteries:  Normal coronary origin.  Right dominance.   Left main: The left main is a large caliber vessel with a normal take off from the left coronary cusp that bifurcates to form a left anterior descending artery and a left circumflex artery. There is no plaque or stenosis.   Left anterior descending artery: The LAD is patent without evidence of plaque or stenosis. The LAD gives off large, patent D1 and very small D2 and D3.   Left circumflex artery: The LCX is non-dominant and patent with no evidence of plaque or stenosis. The LCX gives off small OM1 and large patent OM2, OM3 and OM4.   Right coronary artery: The RCA is dominant with normal take off from the right coronary cusp. There is no evidence of plaque or stenosis. The RCA gives off large RV branch and terminates as a small PDA and right posterolateral branch without evidence of plaque or stenosis.   Right Atrium: Right atrial size is within normal limits.   Right Ventricle: The right ventricular cavity is within normal limits.   Left Atrium: Left atrial size is normal in size with no left atrial appendage filling defect.   Left Ventricle: The ventricular cavity size is within normal limits. There are no stigmata of prior infarction. There is no abnormal filling defect.   Pulmonary arteries: Normal in size without  proximal filling defect.   Pulmonary veins: Normal pulmonary venous drainage.   Pericardium: Normal thickness with no significant effusion or calcium present.   Cardiac valves: The aortic valve is trileaflet without significant calcification. The mitral valve is normal structure without significant calcification.   Aorta: Normal caliber with no significant disease.   Extra-cardiac findings: See attached radiology report for non-cardiac structures.   IMPRESSION: 1. Coronary calcium score of 1.45. This was 43 percentile for age-, sex, and race-matched controls.   2. Normal coronary origin with right dominance.   3. No evidence of CAD.   RECOMMENDATIONS: CAD-RADS 0: No evidence of CAD (0%). Consider non-atherosclerotic causes of chest pain.   EKG:  EKG is personally reviewed.   11/12/2021: not ordered today  Recent Labs: 09/21/2021: ALT 25; Hemoglobin 14.8; Platelets 257.0; TSH 1.75 10/16/2021: BUN 18; Creatinine, Ser 0.92; Potassium 4.9; Sodium 140   Recent Lipid Panel  Component Value Date/Time   CHOL 194 09/21/2021 1227   CHOL 143 11/18/2019 1422   TRIG 176.0 (H) 09/21/2021 1227   HDL 55.00 09/21/2021 1227   HDL 50 11/18/2019 1422   CHOLHDL 4 09/21/2021 1227   VLDL 35.2 09/21/2021 1227   LDLCALC 104 (H) 09/21/2021 1227   LDLCALC 75 11/18/2019 1422    Physical Exam:    VS:  BP (!) 146/94 (BP Location: Left Arm, Patient Position: Sitting, Cuff Size: Large)   Pulse 68   Ht '5\' 8"'$  (1.727 m)   Wt 229 lb (103.9 kg)   BMI 34.82 kg/m     Wt Readings from Last 3 Encounters:  11/12/21 229 lb (103.9 kg)  10/16/21 228 lb 6.4 oz (103.6 kg)  09/21/21 235 lb 12.8 oz (107 kg)    GEN: Well nourished, well developed in no acute distress HEENT: Normal, moist mucous membranes NECK: No JVD CARDIAC: regular rhythm, normal S1 and S2, no rubs or gallops. No murmur. VASCULAR: Radial and DP pulses 2+ bilaterally. No carotid bruits RESPIRATORY:  Clear to auscultation without  rales, wheezing or rhonchi  ABDOMEN: Soft, non-tender, non-distended MUSCULOSKELETAL:  Ambulates independently SKIN: Warm and dry, no edema NEUROLOGIC:  Alert and oriented x 3. No focal neuro deficits noted. PSYCHIATRIC:  Normal affect    ASSESSMENT:    1. Encounter to discuss test results   2. Essential hypertension   3. Mixed hyperlipidemia   4. Family history of heart disease   5. Cardiac risk counseling   6. Counseling on health promotion and disease prevention   7. Metabolic syndrome   8. Diabetes mellitus type 2 in obese [facility])     PLAN:    Test results History of chest pain -we reviewed his CT. No CAD, calcium score of 1. Already on aspirin and atorvastatin -he is working on lifestyle modification, see below  Metabolic syndrome: Type II diabetes, last A1c 6.6 Hypertension Mixed hyperlipidemia Obesity, BMI 34 -he has worked on weight loss for a long time and has struggled. We have discussed GLP1RA like Ozempic, which I think would be a good fit for him. He will continue to work on changes on his own, but if cannot he will discuss GLP1RA with either Dr. Carlota Raspberry or myself -BP not at goal but under significant emotional stress with recent loss of friend. We discussed increasing amlodipine today but he would like to wait on this. Continue lisinopril.  Family history of heart disease Cardiac risk counseling and prevention recommendations: -recommend heart healthy/Mediterranean diet, with whole grains, fruits, vegetable, fish, lean meats, nuts, and olive oil. Limit salt. -recommend moderate walking, 3-5 times/week for 30-50 minutes each session. Aim for at least 150 minutes.week. Goal should be pace of 3 miles/hours, or walking 1.5 miles in 30 minutes -recommend avoidance of tobacco products. Avoid excess alcohol. -ASCVD risk score: The 10-year ASCVD risk score (Arnett DK, et al., 2019) is: 16.3%   Values used to calculate the score:     Age: 4 years     Sex: Male     Is  Non-Hispanic African American: No     Diabetic: Yes     Tobacco smoker: No     Systolic Blood Pressure: 106 mmHg     Is BP treated: Yes     HDL Cholesterol: 55 mg/dL     Total Cholesterol: 194 mg/dL    Plan for follow up: 6 months or sooner as needed.  Buford Dresser, MD, PhD, [facility]    Medication Adjustments/Labs and Tests Ordered: Current medicines are reviewed at length with the patient today.  Concerns regarding medicines are outlined above.   No orders of the defined types were placed in this encounter.  No orders of the defined types were placed in this encounter.  Patient Instructions  Medication Instructions:  Your Physician recommend you continue on your current medication as directed.    *If you need a refill on your cardiac medications before your next appointment, please call your pharmacy*   Lab Work: None ordered today   Testing/Procedures: None ordered today   Follow-Up: At [facility], you and your health needs are our priority.  As part of our continuing mission to provide you with exceptional heart care, we have created designated Provider Care Teams.  These Care Teams include your primary Cardiologist (physician) and Advanced Practice Providers (APPs -  Physician Assistants and Nurse Practitioners) who all work together to provide you with the care you need, when you need it.  We recommend signing up for the patient portal called "MyChart".  Sign up information is provided on this After Visit Summary.  MyChart is used to connect with patients for Virtual Visits (Telemedicine).  Patients are able to view lab/test results, encounter notes, upcoming appointments, etc.  Non-urgent messages can be sent to your provider as well.   To learn more about what you can do with MyChart, go to [email or web address].    Your next appointment:   6 month(s)  The format for your next appointment:   In Person  Provider:    Buford Dresser, MD{            I,Mathew Stumpf,acting as a scribe for Buford Dresser, MD.,have documented all relevant documentation on the behalf of Buford Dresser, MD,as directed by  Buford Dresser, MD while in the presence of Buford Dresser, MD.  I, Buford Dresser, MD, have reviewed all documentation for this visit. The documentation on 11/12/21 for the exam, diagnosis, procedures, and orders are all accurate and complete.   Signed, Buford Dresser, MD PhD 11/12/2021 4:58 PM    [place]

## 2021-11-27 ENCOUNTER — Encounter (HOSPITAL_BASED_OUTPATIENT_CLINIC_OR_DEPARTMENT_OTHER): Payer: Self-pay | Admitting: Cardiology

## 2021-12-05 ENCOUNTER — Other Ambulatory Visit: Payer: Self-pay | Admitting: Family Medicine

## 2021-12-05 DIAGNOSIS — I10 Essential (primary) hypertension: Secondary | ICD-10-CM

## 2021-12-19 ENCOUNTER — Other Ambulatory Visit: Payer: Self-pay | Admitting: Family Medicine

## 2021-12-19 DIAGNOSIS — I10 Essential (primary) hypertension: Secondary | ICD-10-CM

## 2022-03-07 ENCOUNTER — Other Ambulatory Visit: Payer: Self-pay | Admitting: Family Medicine

## 2022-03-07 DIAGNOSIS — I10 Essential (primary) hypertension: Secondary | ICD-10-CM

## 2022-03-13 ENCOUNTER — Other Ambulatory Visit: Payer: Self-pay | Admitting: Family Medicine

## 2022-03-13 DIAGNOSIS — I10 Essential (primary) hypertension: Secondary | ICD-10-CM

## 2022-03-21 ENCOUNTER — Ambulatory Visit: Payer: 59 | Admitting: Family Medicine

## 2022-03-21 ENCOUNTER — Encounter: Payer: Self-pay | Admitting: Family Medicine

## 2022-03-21 VITALS — BP 130/84 | HR 78 | Temp 98.7°F | Ht 68.0 in | Wt 230.0 lb

## 2022-03-21 DIAGNOSIS — E782 Mixed hyperlipidemia: Secondary | ICD-10-CM

## 2022-03-21 DIAGNOSIS — E1169 Type 2 diabetes mellitus with other specified complication: Secondary | ICD-10-CM | POA: Diagnosis not present

## 2022-03-21 DIAGNOSIS — L989 Disorder of the skin and subcutaneous tissue, unspecified: Secondary | ICD-10-CM

## 2022-03-21 DIAGNOSIS — Z23 Encounter for immunization: Secondary | ICD-10-CM | POA: Diagnosis not present

## 2022-03-21 DIAGNOSIS — E669 Obesity, unspecified: Secondary | ICD-10-CM | POA: Diagnosis not present

## 2022-03-21 DIAGNOSIS — I10 Essential (primary) hypertension: Secondary | ICD-10-CM

## 2022-03-21 LAB — LDL CHOLESTEROL, DIRECT: Direct LDL: 130 mg/dL

## 2022-03-21 LAB — COMPREHENSIVE METABOLIC PANEL
ALT: 28 U/L (ref 0–53)
AST: 21 U/L (ref 0–37)
Albumin: 4.5 g/dL (ref 3.5–5.2)
Alkaline Phosphatase: 61 U/L (ref 39–117)
BUN: 17 mg/dL (ref 6–23)
CO2: 29 mEq/L (ref 19–32)
Calcium: 9.6 mg/dL (ref 8.4–10.5)
Chloride: 102 mEq/L (ref 96–112)
Creatinine, Ser: 0.84 mg/dL (ref 0.40–1.50)
GFR: 96.87 mL/min (ref >60.00)
Glucose, Bld: 108 mg/dL — ABNORMAL HIGH (ref 70–99)
Potassium: 4.7 mEq/L (ref 3.5–5.1)
Sodium: 138 mEq/L (ref 135–145)
Total Bilirubin: 0.6 mg/dL (ref 0.2–1.2)
Total Protein: 7.6 g/dL (ref 6.0–8.3)

## 2022-03-21 LAB — LIPID PANEL
Cholesterol: 215 mg/dL — ABNORMAL HIGH (ref 0–200)
HDL: 58.9 mg/dL (ref >39.00)
NonHDL: 155.83
Total CHOL/HDL Ratio: 4
Triglycerides: 207 mg/dL — ABNORMAL HIGH (ref 0.0–149.0)
VLDL: 41.4 mg/dL — ABNORMAL HIGH (ref 0.0–40.0)

## 2022-03-21 LAB — HEMOGLOBIN A1C: Hgb A1c MFr Bld: 6.6 % — ABNORMAL HIGH (ref 4.6–6.5)

## 2022-03-21 LAB — MICROALBUMIN / CREATININE URINE RATIO
Creatinine,U: 138.5 mg/dL
Microalb Creat Ratio: 0.6 mg/g (ref 0.0–30.0)
Microalb, Ur: 0.9 mg/dL (ref 0.0–1.9)

## 2022-03-21 MED ORDER — LISINOPRIL 40 MG PO TABS: 40.0000 mg | ORAL_TABLET | Freq: Every day | ORAL | 1 refills | Status: DC

## 2022-03-21 MED ORDER — AMLODIPINE BESYLATE 5 MG PO TABS: 5.0000 mg | ORAL_TABLET | Freq: Every day | ORAL | 1 refills | Status: DC

## 2022-03-21 NOTE — Patient Instructions (Addendum)
I would consider Ozempic or other injectable for diabetes if A1c is still elevated. That could also help with weight loss.  Slight higher dose of amlodipine should help blood pressure. Let me know if any side effects.  I will refer you to dermatology for the face lesion. Polysporin if needed, but be seen if any surrounding redness or changes.  No other med changes today.  Take care!

## 2022-03-21 NOTE — Progress Notes (Signed)
Subjective:  Patient ID: Bryce Bush., male    DOB: July 18, 1965  Age: 56 y.o. MRN: [id number]  CC:  Chief Complaint  Patient presents with   Hyperlipidemia   Hypertension    Pt states all is wel   PLace on face     Pt has a place on his face that he wants you to look at it     HPI Bryce Bush. presents for   Best friend passed in August. Some difficulty initially, handling things ok now. Denies need for resources at this time.      03/21/2022   10:30 AM 09/21/2021   11:11 AM 03/08/2021    2:25 PM 01/24/2021   10:31 AM 10/04/2020    2:05 PM  Depression screen PHQ 2/9  Decreased Interest 0 0 0 0 0  Down, Depressed, Hopeless 0 0 0 0 0  PHQ - 2 Score 0 0 0 0 0  Altered sleeping 1  0    Tired, decreased energy 1  1    Change in appetite 0  1    Feeling bad or failure about yourself  0  0    Trouble concentrating 0  0    Moving slowly or fidgety/restless 0  0    Suicidal thoughts 0  0    PHQ-9 Score 2  2    Difficult doing work/chores   Not difficult at all       Face lesion Cut on chin with shaving about a years ago. Stays irritated, improves then returns. Tx with neosporin.   Hypertension: Lisinopril 40 mg daily, amlodipine 2.5 mg daily.  Has been evaluated by cardiology, Dr. Harrell Gave, office visit August 21.  Some stress at that time, continued on same regimen. Home readings: 130/80 range.  BP Readings from Last 3 Encounters:  03/21/22 130/84  11/12/21 (!) 146/94  11/06/21 (!) 137/97   Lab Results  Component Value Date   CREATININE 0.92 10/16/2021   Hyperlipidemia: Lipitor 40 mg daily, fish oil. Lab Results  Component Value Date   CHOL 194 09/21/2021   HDL 55.00 09/21/2021   LDLCALC 104 (H) 09/21/2021   TRIG 176.0 (H) 09/21/2021   CHOLHDL 4 09/21/2021   Lab Results  Component Value Date   ALT 25 09/21/2021   AST 20 09/21/2021   ALKPHOS 65 09/21/2021   BILITOT 0.8 09/21/2021   Diabetes: With obesity.  History of prediabetes, A1c increased  to 6.6, diabetic level in June.  No current meds for was discussed at his cardiology visit.  And tried weight loss with multiple diet plans in the past.  Discussed option of GLP-1 for weight loss and diabetic treatment.  No current meds. No change in diet/exercise.  No FH of MEN syndrome. No FH of medullary thyroid CA. No hx of pancreatitis. Optho few weeks ago - less headaches with new glasses.   Wt Readings from Last 3 Encounters:  03/21/22 230 lb (104.3 kg)  11/12/21 229 lb (103.9 kg)  10/16/21 228 lb 6.4 oz (103.6 kg)   Lab Results  Component Value Date   HGBA1C 6.6 (H) 09/21/2021   HGBA1C 6.2 10/04/2020   HGBA1C 5.6 11/18/2019   Lab Results  Component Value Date   LDLCALC 104 (H) 09/21/2021   CREATININE 0.92 10/16/2021     History Patient Active Problem List   Diagnosis Date Noted   Metabolic syndrome 38/12/1749   Diabetes mellitus type 2 in obese ([place]) 10/16/2021   Family history of heart  disease 10/16/2021   Class 1 obesity due to excess calories with serious comorbidity and body mass index (BMI) of 32.0 to 32.9 in adult 12/22/2016   Essential hypertension 03/11/2012   Mixed hyperlipidemia 03/11/2012   Past Medical History:  Diagnosis Date   Class 1 obesity due to excess calories with serious comorbidity and body mass index (BMI) of 32.0 to 32.9 in adult 12/22/2016   Diabetes mellitus type 2 in obese ([place]) 10/16/2021   Essential hypertension 03/11/2012   Family history of heart disease 10/16/2021   GERD (gastroesophageal reflux disease)    Hypercholesteremia    Hypertension    Metabolic syndrome 0/96/2836   Mixed hyperlipidemia 03/11/2012   Past Surgical History:  Procedure Laterality Date   APPENDECTOMY     motor vechile     SPINE SURGERY     Cervical spine surgery   TONSILLECTOMY     No Known Allergies Prior to Admission medications   Medication Sig Start Date End Date Taking? Authorizing Provider  amLODipine (NORVASC) 2.5 MG tablet Take 1 tablet by mouth  once daily 03/07/22  Yes Wendie Agreste, MD  aspirin 81 MG chewable tablet Chew 81 mg by mouth daily.   Yes [provider]  atorvastatin (LIPITOR) 40 MG tablet Take 1 tablet (40 mg total) by mouth daily. 03/08/21  Yes Wendie Agreste, MD  fish oil-omega-3 fatty acids 1000 MG capsule Take 1 g by mouth daily.   Yes [provider]  lisinopril (ZESTRIL) 40 MG tablet Take 1 tablet by mouth once daily 03/13/22  Yes Wendie Agreste, MD  LYSINE PO Take 1 tablet by mouth daily.   Yes [provider]  omeprazole (PRILOSEC) 40 MG capsule Take 1 capsule (40 mg total) by mouth daily. Patient not taking: Reported on 03/21/2022 01/10/21   Milus Banister, MD   Social History   Socioeconomic History   Marital status: Single    Spouse name: Not on file   Number of children: 2   Years of education: Not on file   Highest education level: Not on file  Occupational History   Occupation: truck driver    Comment: [employer]  Tobacco Use   Smoking status: Never   Smokeless tobacco: Never  Substance and Sexual Activity   Alcohol use: No   Drug use: No   Sexual activity: Yes  Other Topics Concern   Not on file  Social History Narrative   Marital status: Single; not dating      Children: 2 children/adopted.  No grandchildren.      Education: [employer].      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      Tobacco: none      Alcohol: none      Drugs: none      Exercise:  Weight room in house; lifts weights.      Seatbelt: 100%; no texting      Sexual activity: none in past year; no STDs.  Females only.           Social Determinants of Health   Financial Resource Strain: Low Risk  (10/16/2021)   Overall Financial Resource Strain (CARDIA)    Difficulty of Paying Living Expenses: Not hard at all  Food Insecurity: No Food Insecurity (10/16/2021)   Hunger Vital Sign    Worried About Running Out of Food in the Last Year: Never  true    Ran Out  of Food in the Last Year: Never true  Transportation Needs: No Transportation Needs (10/16/2021)   PRAPARE - Hydrologist (Medical): No    Lack of Transportation (Non-Medical): No  Physical Activity: Inactive (10/16/2021)   Exercise Vital Sign    Days of Exercise per Week: 0 days    Minutes of Exercise per Session: 0 min  Stress: Not on file  Social Connections: Not on file  Intimate Partner Violence: Not on file    Review of Systems Per HPI.   Objective:   Vitals:   03/21/22 1034 03/21/22 1044  BP: (!) 140/90 130/84  Pulse: 78   Temp: 98.7 F (37.1 C)   SpO2: 97%   Weight: 230 lb (104.3 kg)   Height: '5\' 8"'$  (1.727 m)      Physical Exam Vitals reviewed.  Constitutional:      Appearance: He is well-developed.  HENT:     Head: Normocephalic and atraumatic.  Neck:     Vascular: No carotid bruit or JVD.  Cardiovascular:     Rate and Rhythm: Normal rate and regular rhythm.     Heart sounds: Normal heart sounds. No murmur heard. Pulmonary:     Effort: Pulmonary effort is normal.     Breath sounds: Normal breath sounds. No rales.  Musculoskeletal:     Right lower leg: No edema.     Left lower leg: No edema.  Skin:    General: Skin is warm and dry.     Comments: 28m lesion on chin. See photo.   Neurological:     Mental Status: He is alert and oriented to person, place, and time.  Psychiatric:        Mood and Affect: Mood normal.      Assessment & Plan:  BMicharl Helmes is a 56y.o. male . Type 2 diabetes mellitus with obesity ([place] - Plan: Hemoglobin A1c, Microalbumin / creatinine urine ratio  -Check updated labs medication adjustment accordingly.  Consider GLP-1 weight as well, determine after result review  Need for influenza vaccination - Plan: Flu Vaccine QUAD 6+ mos PF IM (Fluarix Quad PF)  Essential hypertension - Plan: lisinopril (ZESTRIL) 40 MG tablet, amLODipine (NORVASC) 5 MG tablet  -Borderline control,  increase amlodipine to 5 mg daily, continue same dose lisinopril.  Potential side effects discussed with RTC precautions.  Check labs above.  Lesion of face - Plan: Ambulatory referral to Dermatology  -Recurring/nonhealing wound.  Differential includes a skin cancer, differential includes basal cell.  Refer to dermatology.  Watch for secondary infection with RTC precautions.  Polysporin Topical for now.  Mixed hyperlipidemia - Plan: Comprehensive metabolic panel, Lipid panel Tolerating Lipitor, continue same, check updated labs with med adjustment accordingly.  Meds ordered this encounter  Medications   lisinopril (ZESTRIL) 40 MG tablet    Sig: Take 1 tablet (40 mg total) by mouth daily.    Dispense:  90 tablet    Refill:  1   amLODipine (NORVASC) 5 MG tablet    Sig: Take 1 tablet (5 mg total) by mouth daily.    Dispense:  90 tablet    Refill:  1   Patient Instructions  I would consider Ozempic or other injectable for diabetes if A1c is still elevated. That could also help with weight loss.  Slight higher dose of amlodipine should help blood pressure. Let me know if any side effects.  I will refer you to dermatology for the face lesion. Polysporin if  needed, but be seen if any surrounding redness or changes.  No other med changes today.  Take care!         Signed,   [name], MD [place] 03/21/22 11:32 AM

## 2022-03-23 ENCOUNTER — Encounter: Payer: Self-pay | Admitting: Family Medicine

## 2022-03-28 ENCOUNTER — Other Ambulatory Visit: Payer: Self-pay | Admitting: Family Medicine

## 2022-03-28 DIAGNOSIS — E782 Mixed hyperlipidemia: Secondary | ICD-10-CM

## 2022-04-15 ENCOUNTER — Other Ambulatory Visit: Payer: Self-pay | Admitting: Family Medicine

## 2022-04-15 DIAGNOSIS — E782 Mixed hyperlipidemia: Secondary | ICD-10-CM

## 2022-05-14 ENCOUNTER — Ambulatory Visit (HOSPITAL_BASED_OUTPATIENT_CLINIC_OR_DEPARTMENT_OTHER): Payer: 59 | Admitting: Cardiology

## 2022-05-21 ENCOUNTER — Other Ambulatory Visit: Payer: Self-pay | Admitting: Family Medicine

## 2022-05-21 ENCOUNTER — Encounter: Payer: Self-pay | Admitting: Family Medicine

## 2022-05-21 DIAGNOSIS — E782 Mixed hyperlipidemia: Secondary | ICD-10-CM

## 2022-05-30 ENCOUNTER — Encounter: Payer: Self-pay | Admitting: Family Medicine

## 2022-06-11 ENCOUNTER — Telehealth: Payer: Self-pay | Admitting: Family Medicine

## 2022-06-11 ENCOUNTER — Other Ambulatory Visit: Payer: Self-pay

## 2022-06-11 DIAGNOSIS — E782 Mixed hyperlipidemia: Secondary | ICD-10-CM

## 2022-06-11 MED ORDER — ATORVASTATIN CALCIUM 40 MG PO TABS: 40.0000 mg | ORAL_TABLET | Freq: Every day | ORAL | 0 refills | Status: DC

## 2022-06-11 NOTE — Telephone Encounter (Signed)
Spoke with patient and he only asked for refill on atorvastatin (LIPITOR) 40 MG tablet  . Sent in to pharmacy.

## 2022-06-11 NOTE — Telephone Encounter (Signed)
Encourage patient to contact the pharmacy for refills or they can request refills through [place] TO: [place], [place] - [address] NAME & DOSE: amLODipine (NORVASC) 5 MG tablet   atorvastatin (LIPITOR) 40 MG tablet  lisinopril (ZESTRIL) 40 MG tablet  NOTES/COMMENTS FROM PATIENT:He insurance requires a 90 day supply.      [place] office please notify patient: It takes 48-72 hours to process rx refill requests Ask patient to call pharmacy to ensure rx is ready before heading there.

## 2022-06-13 ENCOUNTER — Telehealth: Payer: Self-pay | Admitting: Family Medicine

## 2022-06-13 NOTE — Telephone Encounter (Signed)
Encourage patient to contact the pharmacy for refills or they can request refills through [place] TO:  [place], [place] - [address] NAME & DOSE: lisinopril (ZESTRIL) 40 MG tablet  atorvastatin (LIPITOR) 40 MG tablet   NOTES/COMMENTS FROM PATIENT: 90 day supply insurance covers  [place] office please notify patient: It takes 48-72 hours to process rx refill requests Ask patient to call pharmacy to ensure rx is ready before heading there.

## 2022-06-14 ENCOUNTER — Other Ambulatory Visit: Payer: Self-pay

## 2022-06-14 DIAGNOSIS — E782 Mixed hyperlipidemia: Secondary | ICD-10-CM

## 2022-06-14 DIAGNOSIS — I10 Essential (primary) hypertension: Secondary | ICD-10-CM

## 2022-06-14 MED ORDER — LISINOPRIL 40 MG PO TABS: 40.0000 mg | ORAL_TABLET | Freq: Every day | ORAL | 1 refills | Status: DC

## 2022-06-14 MED ORDER — ATORVASTATIN CALCIUM 40 MG PO TABS: 40.0000 mg | ORAL_TABLET | Freq: Every day | ORAL | 1 refills | Status: DC

## 2022-06-14 NOTE — Telephone Encounter (Signed)
Refilled Lisinopril 40 mg. Last seen in office 12/28 Upcoming Appointment 07/05/2022    Nancy Nordmann [place]  P:[phone]

## 2022-06-20 ENCOUNTER — Ambulatory Visit: Payer: 59 | Admitting: Family Medicine

## 2022-07-05 ENCOUNTER — Encounter: Payer: Self-pay | Admitting: Family Medicine

## 2022-07-05 ENCOUNTER — Ambulatory Visit: Payer: 59 | Admitting: Family Medicine

## 2022-07-05 VITALS — BP 122/80 | HR 64 | Temp 98.4°F | Ht 68.0 in | Wt 229.6 lb

## 2022-07-05 DIAGNOSIS — Z6834 Body mass index (BMI) 34.0-34.9, adult: Secondary | ICD-10-CM

## 2022-07-05 DIAGNOSIS — E669 Obesity, unspecified: Secondary | ICD-10-CM

## 2022-07-05 DIAGNOSIS — E1169 Type 2 diabetes mellitus with other specified complication: Secondary | ICD-10-CM

## 2022-07-05 DIAGNOSIS — E782 Mixed hyperlipidemia: Secondary | ICD-10-CM | POA: Diagnosis not present

## 2022-07-05 DIAGNOSIS — I10 Essential (primary) hypertension: Secondary | ICD-10-CM | POA: Diagnosis not present

## 2022-07-05 LAB — POCT GLYCOSYLATED HEMOGLOBIN (HGB A1C): Hemoglobin A1C: 6 % — AB (ref 4.0–5.6)

## 2022-07-05 MED ORDER — OZEMPIC (0.25 OR 0.5 MG/DOSE) 2 MG/3ML ~~LOC~~ SOPN: 0.2500 mg | PEN_INJECTOR | SUBCUTANEOUS | 1 refills | Status: DC

## 2022-07-05 NOTE — Patient Instructions (Addendum)
No change in blood pressure or cholesterol med for now.  Try adding some low intensity activity such as walking to work on weight loss.as we discussed we can start Ozempic once per week that will treat diabetes and help with weight loss.   See info below.  Recheck in 1 month.  Let me know if you would like to meet with urology to discuss the concerns from today. Return to the clinic or go to the nearest emergency room if any of your symptoms worsen or new symptoms occur.   Return to the clinic or go to the nearest emergency room if any of your symptoms worsen or new symptoms occur.  Semaglutide Injection What is this medication? SEMAGLUTIDE (SEM a GLOO tide) treats type 2 diabetes. It works by increasing insulin levels in your body, which decreases your blood sugar (glucose). It also reduces the amount of sugar released into the blood and slows down your digestion. It can also be used to lower the risk of heart attack and stroke in people with type 2 diabetes. Changes to diet and exercise are often combined with this medication. This medicine may be used for other purposes; ask your health care provider or pharmacist if you have questions. COMMON BRAND NAME(S): OZEMPIC What should I tell my care team before I take this medication? They need to know if you have any of these conditions: Endocrine tumors (MEN 2) or if someone in your family had these tumors Eye disease, vision problems History of pancreatitis Kidney disease Stomach problems Thyroid cancer or if someone in your family had thyroid cancer An unusual or allergic reaction to semaglutide, other medications, foods, dyes, or preservatives Pregnant or trying to get pregnant Breast-feeding How should I use this medication? This medication is for injection under the skin of your upper leg (thigh), stomach area, or upper arm. It is given once every week (every 7 days). You will be taught how to prepare and give this medication. Use  exactly as directed. Take your medication at regular intervals. Do not take it more often than directed. If you use this medication with insulin, you should inject this medication and the insulin separately. Do not mix them together. Do not give the injections right next to each other. Change (rotate) injection sites with each injection. It is important that you put your used needles and syringes in a special sharps container. Do not put them in a trash can. If you do not have a sharps container, call your pharmacist or care team to get one. A special MedGuide will be given to you by the pharmacist with each prescription and refill. Be sure to read this information carefully each time. This medication comes with INSTRUCTIONS FOR USE. Ask your pharmacist for directions on how to use this medication. Read the information carefully. Talk to your pharmacist or care team if you have questions. Talk to your care team about the use of this medication in children. Special care may be needed. Overdosage: If you think you have taken too much of this medicine contact a poison control center or emergency room at once. NOTE: This medicine is only for you. Do not share this medicine with others. What if I miss a dose? If you miss a dose, take it as soon as you can within 5 days after the missed dose. Then take your next dose at your regular weekly time. If it has been longer than 5 days after the missed dose, do not take the missed  dose. Take the next dose at your regular time. Do not take double or extra doses. If you have questions about a missed dose, contact your care team for advice. What may interact with this medication? Other medications for diabetes Many medications may cause changes in blood sugar, these include: Alcohol containing beverages Antiviral medications for HIV or AIDS Aspirin and aspirin-like medications Certain medications for blood pressure, heart disease, irregular heart  beat Chromium Diuretics Male hormones, such as estrogens or progestins, birth control pills Fenofibrate Gemfibrozil Isoniazid Lanreotide Male hormones or anabolic steroids MAOIs like Carbex, Eldepryl, Marplan, Nardil, and Parnate Medications for weight loss Medications for allergies, asthma, cold, or cough Medications for depression, anxiety, or psychotic disturbances Niacin Nicotine NSAIDs, medications for pain and inflammation, like ibuprofen or naproxen Octreotide Pasireotide Pentamidine Phenytoin Probenecid Quinolone antibiotics such as ciprofloxacin, levofloxacin, ofloxacin Some herbal dietary supplements Steroid medications such as prednisone or cortisone Sulfamethoxazole; trimethoprim Thyroid hormones Some medications can hide the warning symptoms of low blood sugar (hypoglycemia). You may need to monitor your blood sugar more closely if you are taking one of these medications. These include: Beta-blockers, often used for high blood pressure or heart problems (examples include atenolol, metoprolol, propranolol) Clonidine Guanethidine Reserpine This list may not describe all possible interactions. Give your health care provider a list of all the medicines, herbs, non-prescription drugs, or dietary supplements you use. Also tell them if you smoke, drink alcohol, or use illegal drugs. Some items may interact with your medicine. What should I watch for while using this medication? Visit your care team for regular checks on your progress. Drink plenty of fluids while taking this medication. Check with your care team if you get an attack of severe diarrhea, nausea, and vomiting. The loss of too much body fluid can make it dangerous for you to take this medication. A test called the HbA1C (A1C) will be monitored. This is a simple blood test. It measures your blood sugar control over the last 2 to 3 months. You will receive this test every 3 to 6 months. Learn how to check your  blood sugar. Learn the symptoms of low and high blood sugar and how to manage them. Always carry a quick-source of sugar with you in case you have symptoms of low blood sugar. Examples include hard sugar candy or glucose tablets. Make sure others know that you can choke if you eat or drink when you develop serious symptoms of low blood sugar, such as seizures or unconsciousness. They must get medical help at once. Tell your care team if you have high blood sugar. You might need to change the dose of your medication. If you are sick or exercising more than usual, you might need to change the dose of your medication. Do not skip meals. Ask your care team if you should avoid alcohol. Many nonprescription cough and cold products contain sugar or alcohol. These can affect blood sugar. Pens should never be shared. Even if the needle is changed, sharing may result in passing of viruses like hepatitis or HIV. Wear a medical ID bracelet or chain, and carry a card that describes your disease and details of your medication and dosage times. Do not become pregnant while taking this medication. Women should inform their care team if they wish to become pregnant or think they might be pregnant. There is a potential for serious side effects to an unborn child. Talk to your care team for more information. What side effects may I notice from  receiving this medication? Side effects that you should report to your care team as soon as possible: Allergic reactions--skin rash, itching, hives, swelling of the face, lips, tongue, or throat Change in vision Dehydration--increased thirst, dry mouth, feeling faint or lightheaded, headache, dark yellow or brown urine Gallbladder problems--severe stomach pain, nausea, vomiting, fever Heart palpitations--rapid, pounding, or irregular heartbeat Kidney injury--decrease in the amount of urine, swelling of the ankles, hands, or feet Pancreatitis--severe stomach pain that spreads to  your back or gets worse after eating or when touched, fever, nausea, vomiting Thyroid cancer--new mass or lump in the neck, pain or trouble swallowing, trouble breathing, hoarseness Side effects that usually do not require medical attention (report to your care team if they continue or are bothersome): Diarrhea Loss of appetite Nausea Stomach pain Vomiting This list may not describe all possible side effects. Call your doctor for medical advice about side effects. You may report side effects to FDA at 1-800-FDA-1088. Where should I keep my medication? Keep out of the reach of children. Store unopened pens in a refrigerator between 2 and 8 degrees C (36 and 46 degrees F). Do not freeze. Protect from light and heat. After you first use the pen, it can be stored for 56 days at room temperature between 15 and 30 degrees C (59 and 86 degrees F) or in a refrigerator. Throw away your used pen after 56 days or after the expiration date, whichever comes first. Do not store your pen with the needle attached. If the needle is left on, medication may leak from the pen. NOTE: This sheet is a summary. It may not cover all possible information. If you have questions about this medicine, talk to your doctor, pharmacist, or health care provider.  2023 Elsevier/Gold Standard (2020-06-15 00:00:00)

## 2022-07-05 NOTE — Progress Notes (Signed)
Subjective:  Patient ID: Bryce Hopes., male    DOB: Oct 11, 1965  Age: 57 y.o. MRN: [id number]  CC:  Chief Complaint  Patient presents with   Medical Management of Chronic Issues    HPI Bryce Bush. presents for   Recovering after surgery on face by dermatology for skin cancer.   Diabetes: With obesity.  Prior prediabetes with an A1c increased to 6.6.  Diet/exercise approach planned.  We did discuss option of GLP-1.  Has cut back on sweets, trying to watch diet. Exercise about the same. Trouble losing weight. Would like to try ozempic.  No known FH of MEN syndrome or thyroid cancer. No hx of   Wt Readings from Last 3 Encounters:  07/05/22 229 lb 9.6 oz (104.1 kg)  03/21/22 230 lb (104.3 kg)  11/12/21 229 lb (103.9 kg)   Microalbumin: 03/21/2022, normal ratio Optho, foot exam, pneumovax:  Foot exam today  Lab Results  Component Value Date   HGBA1C 6.0 (A) 07/05/2022   HGBA1C 6.6 (H) 03/21/2022   HGBA1C 6.6 (H) 09/21/2021   Lab Results  Component Value Date   MICROALBUR 0.9 03/21/2022   LDLCALC 104 (H) 09/21/2021   CREATININE 0.84 03/21/2022    Hypertension: Amlodipine 5 mg daily, lisinopril 40 mg daily. No erectile difficulty - some decreased amount of ejaculate and concern for decreased penile size - plans on weight loss initially, declines urology eval. No change in libido.  No new med side effects. Bp ok at DOT physical   BP Readings from Last 3 Encounters:  07/05/22 122/80  03/21/22 130/84  11/12/21 (!) 146/94   Lab Results  Component Value Date   CREATININE 0.84 03/21/2022   Hyperlipidemia: Lipitor 40 mg daily.  Tolerating, no new side effects. Lab Results  Component Value Date   CHOL 215 (H) 03/21/2022   HDL 58.90 03/21/2022   LDLCALC 104 (H) 09/21/2021   LDLDIRECT 130.0 03/21/2022   TRIG 207.0 (H) 03/21/2022   CHOLHDL 4 03/21/2022   Lab Results  Component Value Date   ALT 28 03/21/2022   AST 21 03/21/2022   ALKPHOS 61 03/21/2022    BILITOT 0.6 03/21/2022        History Patient Active Problem List   Diagnosis Date Noted   Metabolic syndrome 10/16/2021   Diabetes mellitus type 2 in obese 10/16/2021   Family history of heart disease 10/16/2021   Class 1 obesity due to excess calories with serious comorbidity and body mass index (BMI) of 32.0 to 32.9 in adult 12/22/2016   Essential hypertension 03/11/2012   Mixed hyperlipidemia 03/11/2012    Past Medical History:  Diagnosis Date   Class 1 obesity due to excess calories with serious comorbidity and body mass index (BMI) of 32.0 to 32.9 in adult 12/22/2016   Diabetes mellitus type 2 in obese 10/16/2021   Essential hypertension 03/11/2012   Family history of heart disease 10/16/2021   GERD (gastroesophageal reflux disease)    Hypercholesteremia    Hypertension    Metabolic syndrome 10/16/2021   Mixed hyperlipidemia 03/11/2012      Review of Systems  Constitutional:  Negative for fatigue and unexpected weight change.  Eyes:  Negative for visual disturbance.  Respiratory:  Negative for cough, chest tightness and shortness of breath.   Cardiovascular:  Negative for chest pain, palpitations and leg swelling.  Gastrointestinal:  Negative for abdominal pain and blood in stool.  Neurological:  Negative for dizziness, light-headedness and headaches.     Objective:  Vitals:   07/05/22 1023  BP: 122/80  Pulse: 64  Temp: 98.4 F (36.9 C)  TempSrc: Oral  SpO2: 95%  Weight: 229 lb 9.6 oz (104.1 kg)  Height: 5\' 8"  (1.727 m)     Physical Exam Vitals reviewed.  Constitutional:      Appearance: He is well-developed. He is obese.  HENT:     Head: Normocephalic and atraumatic.  Neck:     Vascular: No carotid bruit or JVD.  Cardiovascular:     Rate and Rhythm: Normal rate and regular rhythm.     Heart sounds: Normal heart sounds. No murmur heard. Pulmonary:     Effort: Pulmonary effort is normal.     Breath sounds: Normal breath sounds. No rales.   Musculoskeletal:     Right lower leg: No edema.     Left lower leg: No edema.  Skin:    General: Skin is warm and dry.  Neurological:     Mental Status: He is alert and oriented to person, place, and time.  Psychiatric:        Mood and Affect: Mood normal.        Assessment & Plan:  Bryce Fuentez. is a 57 y.o. male . Type 2 diabetes mellitus with obesity Assessment & Plan: Improved A1c, still with some difficulty with weight loss.  Options were discussed, would like to try GLP-1.  Potential side effects and risk discussed.  Start low-dose at 0.25 mg weekly initially and handout given on use.  Recheck in 1 month and at that point if tolerating consider higher dosing.  Commended on diet changes.  Recommended to increase activity/exercise as able to assist with weight loss.  Orders: -     POCT glycosylated hemoglobin (Hb A1C) -     Ozempic (0.25 or 0.5 MG/DOSE); Inject 0.25 mg into the skin once a week.  Dispense: 3 mL; Refill: 1  Essential hypertension Assessment & Plan:  Stable, tolerating current regimen.  Plan on fasting labs next visit   Mixed hyperlipidemia Assessment & Plan: Tolerating Lipitor, check labs in 3 months.  Continue same regimen for now as anticipate improvement with weight loss, improve diet and exercise changes.   Class 1 obesity without serious comorbidity with body mass index (BMI) of 34.0 to 34.9 in adult, unspecified obesity type Assessment & Plan: As above, difficulty with weight loss.  Diabetic control is improving but we will try GLP-1 to assist with both diabetes control and weight improvements.  Recheck 1 month and exercise.  Commended on diet change.  Orders: -     Ozempic (0.25 or 0.5 MG/DOSE); Inject 0.25 mg into the skin once a week.  Dispense: 3 mL; Refill: 1    Patient Instructions  No change in blood pressure or cholesterol med for now.  Try adding some low intensity activity such as walking to work on weight loss.as we discussed we  can start Ozempic once per week that will treat diabetes and help with weight loss.   See info below.  Recheck in 1 month.  Let me know if you would like to meet with urology to discuss the concerns from today. Return to the clinic or go to the nearest emergency room if any of your symptoms worsen or new symptoms occur.   Return to the clinic or go to the nearest emergency room if any of your symptoms worsen or new symptoms occur.  Semaglutide Injection What is this medication? SEMAGLUTIDE (SEM a GLOO tide) treats type  2 diabetes. It works by increasing insulin levels in your body, which decreases your blood sugar (glucose). It also reduces the amount of sugar released into the blood and slows down your digestion. It can also be used to lower the risk of heart attack and stroke in people with type 2 diabetes. Changes to diet and exercise are often combined with this medication. This medicine may be used for other purposes; ask your health care provider or pharmacist if you have questions. COMMON BRAND NAME(S): OZEMPIC What should I tell my care team before I take this medication? They need to know if you have any of these conditions: Endocrine tumors (MEN 2) or if someone in your family had these tumors Eye disease, vision problems History of pancreatitis Kidney disease Stomach problems Thyroid cancer or if someone in your family had thyroid cancer An unusual or allergic reaction to semaglutide, other medications, foods, dyes, or preservatives Pregnant or trying to get pregnant Breast-feeding How should I use this medication? This medication is for injection under the skin of your upper leg (thigh), stomach area, or upper arm. It is given once every week (every 7 days). You will be taught how to prepare and give this medication. Use exactly as directed. Take your medication at regular intervals. Do not take it more often than directed. If you use this medication with insulin, you should  inject this medication and the insulin separately. Do not mix them together. Do not give the injections right next to each other. Change (rotate) injection sites with each injection. It is important that you put your used needles and syringes in a special sharps container. Do not put them in a trash can. If you do not have a sharps container, call your pharmacist or care team to get one. A special MedGuide will be given to you by the pharmacist with each prescription and refill. Be sure to read this information carefully each time. This medication comes with INSTRUCTIONS FOR USE. Ask your pharmacist for directions on how to use this medication. Read the information carefully. Talk to your pharmacist or care team if you have questions. Talk to your care team about the use of this medication in children. Special care may be needed. Overdosage: If you think you have taken too much of this medicine contact a poison control center or emergency room at once. NOTE: This medicine is only for you. Do not share this medicine with others. What if I miss a dose? If you miss a dose, take it as soon as you can within 5 days after the missed dose. Then take your next dose at your regular weekly time. If it has been longer than 5 days after the missed dose, do not take the missed dose. Take the next dose at your regular time. Do not take double or extra doses. If you have questions about a missed dose, contact your care team for advice. What may interact with this medication? Other medications for diabetes Many medications may cause changes in blood sugar, these include: Alcohol containing beverages Antiviral medications for HIV or AIDS Aspirin and aspirin-like medications Certain medications for blood pressure, heart disease, irregular heart beat Chromium Diuretics Male hormones, such as estrogens or progestins, birth control pills Fenofibrate Gemfibrozil Isoniazid Lanreotide Male hormones or anabolic  steroids MAOIs like Carbex, Eldepryl, Marplan, Nardil, and Parnate Medications for weight loss Medications for allergies, asthma, cold, or cough Medications for depression, anxiety, or psychotic disturbances Niacin Nicotine NSAIDs, medications for pain and inflammation,  like ibuprofen or naproxen Octreotide Pasireotide Pentamidine Phenytoin Probenecid Quinolone antibiotics such as ciprofloxacin, levofloxacin, ofloxacin Some herbal dietary supplements Steroid medications such as prednisone or cortisone Sulfamethoxazole; trimethoprim Thyroid hormones Some medications can hide the warning symptoms of low blood sugar (hypoglycemia). You may need to monitor your blood sugar more closely if you are taking one of these medications. These include: Beta-blockers, often used for high blood pressure or heart problems (examples include atenolol, metoprolol, propranolol) Clonidine Guanethidine Reserpine This list may not describe all possible interactions. Give your health care provider a list of all the medicines, herbs, non-prescription drugs, or dietary supplements you use. Also tell them if you smoke, drink alcohol, or use illegal drugs. Some items may interact with your medicine. What should I watch for while using this medication? Visit your care team for regular checks on your progress. Drink plenty of fluids while taking this medication. Check with your care team if you get an attack of severe diarrhea, nausea, and vomiting. The loss of too much body fluid can make it dangerous for you to take this medication. A test called the HbA1C (A1C) will be monitored. This is a simple blood test. It measures your blood sugar control over the last 2 to 3 months. You will receive this test every 3 to 6 months. Learn how to check your blood sugar. Learn the symptoms of low and high blood sugar and how to manage them. Always carry a quick-source of sugar with you in case you have symptoms of low blood  sugar. Examples include hard sugar candy or glucose tablets. Make sure others know that you can choke if you eat or drink when you develop serious symptoms of low blood sugar, such as seizures or unconsciousness. They must get medical help at once. Tell your care team if you have high blood sugar. You might need to change the dose of your medication. If you are sick or exercising more than usual, you might need to change the dose of your medication. Do not skip meals. Ask your care team if you should avoid alcohol. Many nonprescription cough and cold products contain sugar or alcohol. These can affect blood sugar. Pens should never be shared. Even if the needle is changed, sharing may result in passing of viruses like hepatitis or HIV. Wear a medical ID bracelet or chain, and carry a card that describes your disease and details of your medication and dosage times. Do not become pregnant while taking this medication. Women should inform their care team if they wish to become pregnant or think they might be pregnant. There is a potential for serious side effects to an unborn child. Talk to your care team for more information. What side effects may I notice from receiving this medication? Side effects that you should report to your care team as soon as possible: Allergic reactions--skin rash, itching, hives, swelling of the face, lips, tongue, or throat Change in vision Dehydration--increased thirst, dry mouth, feeling faint or lightheaded, headache, dark yellow or brown urine Gallbladder problems--severe stomach pain, nausea, vomiting, fever Heart palpitations--rapid, pounding, or irregular heartbeat Kidney injury--decrease in the amount of urine, swelling of the ankles, hands, or feet Pancreatitis--severe stomach pain that spreads to your back or gets worse after eating or when touched, fever, nausea, vomiting Thyroid cancer--new mass or lump in the neck, pain or trouble swallowing, trouble breathing,  hoarseness Side effects that usually do not require medical attention (report to your care team if they continue or are bothersome):  Diarrhea Loss of appetite Nausea Stomach pain Vomiting This list may not describe all possible side effects. Call your doctor for medical advice about side effects. You may report side effects to FDA at 1-800-FDA-1088. Where should I keep my medication? Keep out of the reach of children. Store unopened pens in a refrigerator between 2 and 8 degrees C (36 and 46 degrees F). Do not freeze. Protect from light and heat. After you first use the pen, it can be stored for 56 days at room temperature between 15 and 30 degrees C (59 and 86 degrees F) or in a refrigerator. Throw away your used pen after 56 days or after the expiration date, whichever comes first. Do not store your pen with the needle attached. If the needle is left on, medication may leak from the pen. NOTE: This sheet is a summary. It may not cover all possible information. If you have questions about this medicine, talk to your doctor, pharmacist, or health care provider.  2023 [employer]vier/Gold Standard (2020-06-15 00:00:00)      Signed,   Meredith Staggers, MD [facility], [facility] 07/05/22 10:58 AM

## 2022-07-05 NOTE — Assessment & Plan Note (Signed)
Improved A1c, still with some difficulty with weight loss.  Options were discussed, would like to try GLP-1.  Potential side effects and risk discussed.  Start low-dose at 0.25 mg weekly initially and handout given on use.  Recheck in 1 month and at that point if tolerating consider higher dosing.  Commended on diet changes.  Recommended to increase activity/exercise as able to assist with weight loss.

## 2022-07-05 NOTE — Assessment & Plan Note (Addendum)
Tolerating Lipitor, check labs in 3 months.  Continue same regimen for now as anticipate improvement with weight loss, improve diet and exercise changes.

## 2022-07-05 NOTE — Assessment & Plan Note (Signed)
Stable, tolerating current regimen.  Plan on fasting labs next visit

## 2022-07-05 NOTE — Assessment & Plan Note (Signed)
As above, difficulty with weight loss.  Diabetic control is improving but we will try GLP-1 to assist with both diabetes control and weight improvements.  Recheck 1 month and exercise.  Commended on diet change.

## 2022-07-18 ENCOUNTER — Telehealth: Payer: Self-pay | Admitting: Family Medicine

## 2022-07-18 NOTE — Telephone Encounter (Signed)
Patient called to check the status of ozempic. Patient was told by pharmacy that he needed to contact us. Do we have a update on this medication?

## 2022-07-18 NOTE — Telephone Encounter (Signed)
PA needed for this prescription of Ozempic

## 2022-07-18 NOTE — Telephone Encounter (Signed)
Called pharmacy he requires PA sending message to PA team pt made aware

## 2022-07-29 ENCOUNTER — Other Ambulatory Visit (HOSPITAL_COMMUNITY): Payer: Self-pay

## 2022-07-31 NOTE — Telephone Encounter (Signed)
PA was submitted to [facility] via CMM on 07/26/22. Will update once determination has been made.

## 2022-08-09 ENCOUNTER — Ambulatory Visit: Payer: 59 | Admitting: Family Medicine

## 2022-08-09 ENCOUNTER — Other Ambulatory Visit (HOSPITAL_COMMUNITY): Payer: Self-pay

## 2022-08-09 NOTE — Telephone Encounter (Signed)
Informed pt that the Rx Ozempic was approved

## 2022-08-09 NOTE — Telephone Encounter (Signed)
PA approved. Effective dates: 07/26/22-07/26/23

## 2022-08-14 ENCOUNTER — Ambulatory Visit (INDEPENDENT_AMBULATORY_CARE_PROVIDER_SITE_OTHER): Payer: 59 | Admitting: Family Medicine

## 2022-08-14 ENCOUNTER — Encounter: Payer: Self-pay | Admitting: Family Medicine

## 2022-08-14 VITALS — BP 136/74 | HR 92 | Temp 99.6°F | Ht 68.0 in | Wt 225.4 lb

## 2022-08-14 DIAGNOSIS — R519 Headache, unspecified: Secondary | ICD-10-CM

## 2022-08-14 DIAGNOSIS — R350 Frequency of micturition: Secondary | ICD-10-CM

## 2022-08-14 DIAGNOSIS — M791 Myalgia, unspecified site: Secondary | ICD-10-CM

## 2022-08-14 DIAGNOSIS — J3489 Other specified disorders of nose and nasal sinuses: Secondary | ICD-10-CM | POA: Diagnosis not present

## 2022-08-14 DIAGNOSIS — J029 Acute pharyngitis, unspecified: Secondary | ICD-10-CM | POA: Diagnosis not present

## 2022-08-14 DIAGNOSIS — R21 Rash and other nonspecific skin eruption: Secondary | ICD-10-CM

## 2022-08-14 DIAGNOSIS — R6883 Chills (without fever): Secondary | ICD-10-CM | POA: Diagnosis not present

## 2022-08-14 DIAGNOSIS — R319 Hematuria, unspecified: Secondary | ICD-10-CM

## 2022-08-14 LAB — COMPREHENSIVE METABOLIC PANEL
ALT: 17 U/L (ref 0–53)
AST: 16 U/L (ref 0–37)
Albumin: 4.6 g/dL (ref 3.5–5.2)
Alkaline Phosphatase: 67 U/L (ref 39–117)
BUN: 18 mg/dL (ref 6–23)
CO2: 28 mEq/L (ref 19–32)
Calcium: 9.7 mg/dL (ref 8.4–10.5)
Chloride: 101 mEq/L (ref 96–112)
Creatinine, Ser: 0.99 mg/dL (ref 0.40–1.50)
GFR: 84.38 mL/min (ref >60.00)
Glucose, Bld: 121 mg/dL — ABNORMAL HIGH (ref 70–99)
Potassium: 4 mEq/L (ref 3.5–5.1)
Sodium: 139 mEq/L (ref 135–145)
Total Bilirubin: 0.6 mg/dL (ref 0.2–1.2)
Total Protein: 7.3 g/dL (ref 6.0–8.3)

## 2022-08-14 LAB — POC INFLUENZA A&B (BINAX/QUICKVUE)
Influenza A, POC: NEGATIVE
Influenza B, POC: NEGATIVE

## 2022-08-14 LAB — CBC
HCT: 45.6 % (ref 39.0–52.0)
Hemoglobin: 15.3 g/dL (ref 13.0–17.0)
MCHC: 33.5 g/dL (ref 30.0–36.0)
MCV: 90.3 fl (ref 78.0–100.0)
Platelets: 241 10*3/uL (ref 150.0–400.0)
RBC: 5.05 Mil/uL (ref 4.22–5.81)
RDW: 13.9 % (ref 11.5–15.5)
WBC: 8.1 10*3/uL (ref 4.0–10.5)

## 2022-08-14 LAB — POCT URINALYSIS DIP (MANUAL ENTRY)
Bilirubin, UA: NEGATIVE
Glucose, UA: NEGATIVE mg/dL
Ketones, POC UA: NEGATIVE mg/dL
Leukocytes, UA: NEGATIVE
Nitrite, UA: NEGATIVE
Protein Ur, POC: NEGATIVE mg/dL
Spec Grav, UA: >=1.03 — AB (ref 1.010–1.025)
Urobilinogen, UA: 0.2 E.U./dL — AB
pH, UA: 6 (ref 5.0–8.0)

## 2022-08-14 LAB — POC COVID19 BINAXNOW: SARS Coronavirus 2 Ag: NEGATIVE

## 2022-08-14 LAB — PSA: PSA: 0.81 ng/mL (ref 0.10–4.00)

## 2022-08-14 LAB — POCT RAPID STREP A (OFFICE): Rapid Strep A Screen: NEGATIVE

## 2022-08-14 MED ORDER — DOXYCYCLINE HYCLATE 100 MG PO TABS: 100.0000 mg | ORAL_TABLET | Freq: Two times a day (BID) | ORAL | 0 refills | Status: DC

## 2022-08-14 NOTE — Patient Instructions (Addendum)
Testing in office overall reassuring. You did have some blood in the urine, but no other sign of infection. With your symptoms I will check other testing today and do recommend starting antibiotic doxycycline 1 pill twice per day.  Make sure to drink plenty of fluids and rest.  Tylenol or ibuprofen can be used for now for headache.  If any concerns on labs I will let you know but either of those medicines can be used for headache for now, and can be combined if needed.  Just keep track of how often you are taking them and no more frequently than every 4-6 hours for Tylenol, every 6 hours for ibuprofen.  Recheck with me in 2 days.  We can repeat a COVID test at that time, but if you want to do 1 tomorrow night at home and that can also be helpful. Out of work for now, and I do recommend wearing mask around others for now until we have further testing.  Hang in there and I hope you feel better soon.  Return to the clinic or go to the nearest emergency room if any of your symptoms worsen or new symptoms occur.

## 2022-08-14 NOTE — Progress Notes (Signed)
Subjective:  Patient ID: Bryce Bush., male    DOB: Aug 11, 1965  Age: 57 y.o. MRN: [id number]  CC:  Chief Complaint  Patient presents with   Generalized Body Aches    Pt notes general fatigue and body aches, some sinus irritation yesterday while driving in [place], had a nose bleed overnight that woke him, notes nothing he could've hit his head or face on to have caused this. Has had problems with high pollen count years in past  Tried taking alkaseltzer and is worried this was a mistake with his medication      HPI Bryce Bush. presents for   Fatigue, body aches, nosebleed: Started yesterday. Driving to [place], [place]. Burning pain in back pf nose/sinus and in back of throat.  Took alka seltzer plus last night, frequent urination last night - 25 times to urinate. Slight nauseas this am, now resolved. No abd pain or vomiting. No dysuria or hematuria. Woke up with nasal congestion this morning, some blood in nasal congestion and headache today - bad headache. No vision changes. Diffuse joint/bodyaches, face hurts. Able to swallow ok.  Urinating better now this am.  Similar symptoms 20-30 years ago - treated with abx injection for sinus infection?  Felt ok prior to yesterday. No known sick contacts.  Cold sweats last night, no measured temp.  No tick bites, recent camping or new rash. Some red spots on chest - not sure if those are new, no other rash. Inside dog only.  Tx: alka seltzer only. Ibuprofen yesterday.  No neck stiffness, photophobia or phonophobia.   History Patient Active Problem List   Diagnosis Date Noted   Metabolic syndrome 10/16/2021   Type 2 diabetes mellitus with obesity (HCC) 10/16/2021   Family history of heart disease 10/16/2021   Class 1 obesity without serious comorbidity with body mass index (BMI) of 34.0 to 34.9 in adult 12/22/2016   Essential hypertension 03/11/2012   Mixed hyperlipidemia 03/11/2012   Past Medical History:  Diagnosis Date   Class 1  obesity due to excess calories with serious comorbidity and body mass index (BMI) of 32.0 to 32.9 in adult 12/22/2016   Diabetes mellitus type 2 in obese 10/16/2021   Essential hypertension 03/11/2012   Family history of heart disease 10/16/2021   GERD (gastroesophageal reflux disease)    Hypercholesteremia    Hypertension    Metabolic syndrome 10/16/2021   Mixed hyperlipidemia 03/11/2012   Past Surgical History:  Procedure Laterality Date   APPENDECTOMY     motor vechile     SPINE SURGERY     Cervical spine surgery   TONSILLECTOMY     No Known Allergies Prior to Admission medications   Medication Sig Start Date End Date Taking? Authorizing Provider  amLODipine (NORVASC) 5 MG tablet Take 1 tablet (5 mg total) by mouth daily. 03/21/22  Yes Shade Flood, MD  aspirin 81 MG chewable tablet Chew 81 mg by mouth daily.   Yes [provider]  atorvastatin (LIPITOR) 40 MG tablet Take 1 tablet (40 mg total) by mouth daily. 06/14/22  Yes Shade Flood, MD  fish oil-omega-3 fatty acids 1000 MG capsule Take 1 g by mouth daily.   Yes [provider]  lisinopril (ZESTRIL) 40 MG tablet Take 1 tablet (40 mg total) by mouth daily. 06/14/22  Yes Shade Flood, MD  Semaglutide,0.25 or 0.5MG /DOS, (OZEMPIC, 0.25 OR 0.5 MG/DOSE,) 2 MG/3ML SOPN Inject 0.25 mg into the skin once a week. 07/05/22  Yes  Shade Flood, MD  LYSINE PO Take 1 tablet by mouth daily. Patient not taking: Reported on 07/05/2022    [provider]  traMADol (ULTRAM) 50 MG tablet Take 50 mg by mouth every 4 (four) hours. Patient not taking: Reported on 07/05/2022 04/19/22   [provider]   Social History   Socioeconomic History   Marital status: Single    Spouse name: Not on file   Number of children: 2   Years of education: Not on file   Highest education level: Not on file  Occupational History   Occupation: truck driver    Comment: [employer]  Tobacco Use   Smoking status: Never    Smokeless tobacco: Never  Substance and Sexual Activity   Alcohol use: No   Drug use: No   Sexual activity: Yes  Other Topics Concern   Not on file  Social History Narrative   Marital status: Single; not dating      Children: 2 children/adopted.  No grandchildren.      Education: [employer].      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      Tobacco: none      Alcohol: none      Drugs: none      Exercise:  Weight room in house; lifts weights.      Seatbelt: 100%; no texting      Sexual activity: none in past year; no STDs.  Females only.           Social Determinants of Health   Financial Resource Strain: Low Risk  (10/16/2021)   Overall Financial Resource Strain (CARDIA)    Difficulty of Paying Living Expenses: Not hard at all  Food Insecurity: No Food Insecurity (10/16/2021)   Hunger Vital Sign    Worried About Running Out of Food in the Last Year: Never true    Ran Out of Food in the Last Year: Never true  Transportation Needs: No Transportation Needs (10/16/2021)   PRAPARE - Administrator, Civil Service (Medical): No    Lack of Transportation (Non-Medical): No  Physical Activity: Inactive (10/16/2021)   Exercise Vital Sign    Days of Exercise per Week: 0 days    Minutes of Exercise per Session: 0 min  Stress: Not on file  Social Connections: Not on file  Intimate Partner Violence: Not on file    Review of Systems Per HPI.   Objective:   Vitals:   08/14/22 1041  BP: 136/74  Pulse: 92  Temp: 99.6 F (37.6 C)  TempSrc: Oral  SpO2: 98%  Weight: 225 lb 6.4 oz (102.2 kg)  Height: 5\' 8"  (1.727 m)     Physical Exam Vitals reviewed.  Constitutional:      Appearance: He is well-developed.  HENT:     Head: Normocephalic and atraumatic.     Right Ear: Tympanic membrane, ear canal and external ear normal.     Left Ear: Tympanic membrane, ear canal and external ear normal.     Nose: No congestion or  rhinorrhea.     Comments: Frontal sinus ttp bilaterally.     Mouth/Throat:     Pharynx: No oropharyngeal exudate or posterior oropharyngeal erythema.     Comments: No rash or lesions in mouth, no exudate/erythema of posterior OP.  Eyes:     Conjunctiva/sclera: Conjunctivae normal.     Pupils: Pupils are equal, round, and reactive to  light.  Neck:     Comments: Prominent AC nodes bilaterally, nontender.  Cardiovascular:     Rate and Rhythm: Normal rate and regular rhythm.     Heart sounds: Normal heart sounds. No murmur heard. Pulmonary:     Effort: Pulmonary effort is normal.     Breath sounds: Normal breath sounds. No wheezing, rhonchi or rales.  Abdominal:     Palpations: Abdomen is soft.     Tenderness: There is no abdominal tenderness.  Musculoskeletal:     Cervical back: Neck supple.  Lymphadenopathy:     Cervical: No cervical adenopathy.  Skin:    General: Skin is warm and dry.     Findings: No rash.     Comments: Small red lesions of chest/abdomen - see photos. No rash of arms, legs, face.   Neurological:     Mental Status: He is alert and oriented to person, place, and time.  Psychiatric:        Behavior: Behavior normal.      Results for orders placed or performed in visit on 08/14/22  POCT rapid strep A  Result Value Ref Range   Rapid Strep A Screen Negative Negative  POC Influenza A&B(BINAX/QUICKVUE)  Result Value Ref Range   Influenza A, POC Negative Negative   Influenza B, POC Negative Negative  POCT urinalysis dipstick  Result Value Ref Range   Color, UA straw (A) yellow   Clarity, UA clear clear   Glucose, UA negative negative mg/dL   Bilirubin, UA negative negative   Ketones, POC UA negative negative mg/dL   Spec Grav, UA >=9.604 (A) 1.010 - 1.025   Blood, UA moderate (A) negative   pH, UA 6.0 5.0 - 8.0   Protein Ur, POC negative negative mg/dL   Urobilinogen, UA 0.2 (A) 0.2 or 1.0 E.U./dL   Nitrite, UA Negative Negative   Leukocytes, UA  Negative Negative  POC COVID-19 BinaxNow  Result Value Ref Range   SARS Coronavirus 2 Ag Negative Negative    Assessment & Plan:  Bryce Bush. is a 57 y.o. male . Acute nonintractable headache, unspecified headache type - Plan: CBC, Rocky mtn spotted fvr abs pnl(IgG+IgM), doxycycline (VIBRA-TABS) 100 MG tablet, Comprehensive metabolic panel  Myalgia - Plan: POC Influenza A&B(BINAX/QUICKVUE), Rocky mtn spotted fvr abs pnl(IgG+IgM), Comprehensive metabolic panel  Chills - Plan: CBC, PSA, POCT rapid strep A, POC Influenza A&B(BINAX/QUICKVUE), POCT urinalysis dipstick, POC COVID-19 BinaxNow, doxycycline (VIBRA-TABS) 100 MG tablet, Comprehensive metabolic panel  Sore throat - Plan: CBC, POCT rapid strep A, POC Influenza A&B(BINAX/QUICKVUE), POC COVID-19 BinaxNow  Sinus pain - Plan: POC Influenza A&B(BINAX/QUICKVUE), POC COVID-19 BinaxNow  Urinary frequency - Plan: PSA, POCT urinalysis dipstick, Urine Culture, doxycycline (VIBRA-TABS) 100 MG tablet  Rash and nonspecific skin eruption - Plan: Rocky mtn spotted fvr abs pnl(IgG+IgM), Comprehensive metabolic panel  Hematuria, unspecified type - Plan: Urine Culture, Comprehensive metabolic panel  1 day history of diffuse myalgias, headache, chills, sore throat and sinus pain.  Urinary frequency overnight with hematuria noted in office today.  Single episode of blood within nasal discharge without true epistaxis.  He did note possible rash of chest but that appears to be multiple cherry angiomas.  No appreciated rash on face, extremities and oral mucosa appears normal.  No petechiae/purpura appreciated.  COVID, flu, strep testing negative, however early in course of illness.  -Check CBC, CMP, RMSF titer, start doxycycline to cover for possible tickborne illness although less likely without known tick exposure.  May also provide  some coverage if early sinus infection versus UTI with frequent urination and hematuria although in office urinalysis  reassuring.    -Tylenol or ibuprofen for now for headache, fluids, rest, ER precautions.  Check LFTs on CMP  -Option to repeat COVID test at home tomorrow or can be repeated on Friday.  Masking recommended for now.  -48-hour recheck with ER precautions in the interim.  Understanding of plan expressed.   Meds ordered this encounter  Medications   doxycycline (VIBRA-TABS) 100 MG tablet    Sig: Take 1 tablet (100 mg total) by mouth 2 (two) times daily.    Dispense:  20 tablet    Refill:  0   Patient Instructions  Testing in office overall reassuring. You did have some blood in the urine, but no other sign of infection. With your symptoms I will check other testing today and do recommend starting antibiotic doxycycline 1 pill twice per day.  Make sure to drink plenty of fluids and rest.  Tylenol or ibuprofen can be used for now for headache.  If any concerns on labs I will let you know but either of those medicines can be used for headache for now, and can be combined if needed.  Just keep track of how often you are taking them and no more frequently than every 4-6 hours for Tylenol, every 6 hours for ibuprofen.  Recheck with me in 2 days.  We can repeat a COVID test at that time, but if you want to do 1 tomorrow night at home and that can also be helpful. Out of work for now, and I do recommend wearing mask around others for now until we have further testing.  Hang in there and I hope you feel better soon.  Return to the clinic or go to the nearest emergency room if any of your symptoms worsen or new symptoms occur.     Signed,   Meredith Staggers, MD [facility], [facility] 08/14/22 12:01 PM

## 2022-08-15 LAB — URINE CULTURE
MICRO NUMBER:: 14990337
SPECIMEN QUALITY:: ADEQUATE

## 2022-08-16 ENCOUNTER — Encounter: Payer: Self-pay | Admitting: Family Medicine

## 2022-08-16 ENCOUNTER — Ambulatory Visit (INDEPENDENT_AMBULATORY_CARE_PROVIDER_SITE_OTHER): Payer: 59 | Admitting: Family Medicine

## 2022-08-16 VITALS — BP 128/76 | HR 68 | Temp 98.5°F | Ht 68.0 in | Wt 228.6 lb

## 2022-08-16 DIAGNOSIS — J3489 Other specified disorders of nose and nasal sinuses: Secondary | ICD-10-CM | POA: Diagnosis not present

## 2022-08-16 DIAGNOSIS — J069 Acute upper respiratory infection, unspecified: Secondary | ICD-10-CM | POA: Diagnosis not present

## 2022-08-16 DIAGNOSIS — R051 Acute cough: Secondary | ICD-10-CM

## 2022-08-16 DIAGNOSIS — R3129 Other microscopic hematuria: Secondary | ICD-10-CM | POA: Diagnosis not present

## 2022-08-16 DIAGNOSIS — R6889 Other general symptoms and signs: Secondary | ICD-10-CM | POA: Diagnosis not present

## 2022-08-16 LAB — ROCKY MTN SPOTTED FVR ABS PNL(IGG+IGM)
RMSF IgG: NOT DETECTED
RMSF IgM: NOT DETECTED

## 2022-08-16 MED ORDER — BENZONATATE 100 MG PO CAPS: 100.0000 mg | ORAL_CAPSULE | Freq: Three times a day (TID) | ORAL | 0 refills | Status: DC | PRN

## 2022-08-16 MED ORDER — AZELASTINE HCL 0.1 % NA SOLN: 1.0000 | Freq: Two times a day (BID) | NASAL | 3 refills | Status: DC | PRN

## 2022-08-16 NOTE — Progress Notes (Signed)
Subjective:  Patient ID: Bryce Hopes., male    DOB: [birth year]  Age: 57 y.o. MRN: [id number]  CC:  Chief Complaint  Patient presents with   Headache    Notes headache and sinus burning went away, notes now has a cough and sinus drainage.     HPI Bryce Jha. presents for   Follow-up headache, sinus pain, chills, myalgias Seen 2 days ago.  1 day history at that time of myalgia, headache, chills sore throat and sinus pain.  Urinary frequency overnight with hematuria noted in office.  Single episode of blood within nasal discharge without true epistaxis that time.  Suspected cherry angiomas on chest versus actual rash but given other symptoms started doxycycline for possible coverage of sinus infection, tickborne illness, and with hematuria urethritis versus UTI.  COVID, flu, strep testing was negative last visit but early in the course of his illness.  RMSF titer ordered.  Symptomatic care discussed.  Since last visit headache and sinus burning has improved but still with some cough, sinus drainage, sore throat that improved with ricola lozenges. Overall better.  Has not repeated covid test.  Tolerating antibiotic.   No fever.  History of microscopic hematuria - no prior urology eval.  No gross hematuria. No recurrence of urinary frequency.   Firm areas under neck noted last visit. No prior thyroid imaging, priro C spine surgery with anterior approach. Brother with Graves disease. No personal hx of thyroid disease.  Lab Results  Component Value Date   TSH 1.75 09/21/2021       History Patient Active Problem List   Diagnosis Date Noted   Metabolic syndrome 10/16/2021   Type 2 diabetes mellitus with obesity (HCC) 10/16/2021   Family history of heart disease 10/16/2021   Class 1 obesity without serious comorbidity with body mass index (BMI) of 34.0 to 34.9 in adult 12/22/2016   Essential hypertension 03/11/2012   Mixed hyperlipidemia 03/11/2012   Past Medical History:   Diagnosis Date   Class 1 obesity due to excess calories with serious comorbidity and body mass index (BMI) of 32.0 to 32.9 in adult 12/22/2016   Diabetes mellitus type 2 in obese 10/16/2021   Essential hypertension 03/11/2012   Family history of heart disease 10/16/2021   GERD (gastroesophageal reflux disease)    Hypercholesteremia    Hypertension    Metabolic syndrome 10/16/2021   Mixed hyperlipidemia 03/11/2012   Past Surgical History:  Procedure Laterality Date   APPENDECTOMY     motor vechile     SPINE SURGERY     Cervical spine surgery   TONSILLECTOMY     No Known Allergies Prior to Admission medications   Medication Sig Start Date End Date Taking? Authorizing Provider  amLODipine (NORVASC) 5 MG tablet Take 1 tablet (5 mg total) by mouth daily. 03/21/22  Yes Shade Flood, MD  aspirin 81 MG chewable tablet Chew 81 mg by mouth daily.   Yes [provider]  atorvastatin (LIPITOR) 40 MG tablet Take 1 tablet (40 mg total) by mouth daily. 06/14/22  Yes Shade Flood, MD  doxycycline (VIBRA-TABS) 100 MG tablet Take 1 tablet (100 mg total) by mouth 2 (two) times daily. 08/14/22  Yes Shade Flood, MD  fish oil-omega-3 fatty acids 1000 MG capsule Take 1 g by mouth daily.   Yes [provider]  lisinopril (ZESTRIL) 40 MG tablet Take 1 tablet (40 mg total) by mouth daily. 06/14/22  Yes Shade Flood, MD  LYSINE  PO Take 1 tablet by mouth daily.   Yes [provider]  Semaglutide,0.25 or 0.5MG /DOS, (OZEMPIC, 0.25 OR 0.5 MG/DOSE,) 2 MG/3ML SOPN Inject 0.25 mg into the skin once a week. 07/05/22  Yes Shade Flood, MD  traMADol (ULTRAM) 50 MG tablet Take 50 mg by mouth every 4 (four) hours. 04/19/22  Yes [provider]   Social History   Socioeconomic History   Marital status: Single    Spouse name: Not on file   Number of children: 2   Years of education: Not on file   Highest education level: Not on file  Occupational History    Occupation: truck driver    Comment: [employer]  Tobacco Use   Smoking status: Never   Smokeless tobacco: Never  Substance and Sexual Activity   Alcohol use: No   Drug use: No   Sexual activity: Yes  Other Topics Concern   Not on file  Social History Narrative   Marital status: Single; not dating      Children: 2 children/adopted.  No grandchildren.      Education: [employer].      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      Tobacco: none      Alcohol: none      Drugs: none      Exercise:  Weight room in house; lifts weights.      Seatbelt: 100%; no texting      Sexual activity: none in past year; no STDs.  Females only.           Social Determinants of Health   Financial Resource Strain: Low Risk  (10/16/2021)   Overall Financial Resource Strain (CARDIA)    Difficulty of Paying Living Expenses: Not hard at all  Food Insecurity: No Food Insecurity (10/16/2021)   Hunger Vital Sign    Worried About Running Out of Food in the Last Year: Never true    Ran Out of Food in the Last Year: Never true  Transportation Needs: No Transportation Needs (10/16/2021)   PRAPARE - Administrator, Civil Service (Medical): No    Lack of Transportation (Non-Medical): No  Physical Activity: Inactive (10/16/2021)   Exercise Vital Sign    Days of Exercise per Week: 0 days    Minutes of Exercise per Session: 0 min  Stress: Not on file  Social Connections: Not on file  Intimate Partner Violence: Not on file    Review of Systems   Objective:   Vitals:   08/16/22 0914  BP: 128/76  Pulse: 68  Temp: 98.5 F (36.9 C)  TempSrc: Temporal  SpO2: 98%  Weight: 228 lb 9.6 oz (103.7 kg)  Height: 5\' 8"  (1.727 m)     Physical Exam Vitals reviewed.  Constitutional:      Appearance: He is well-developed.  HENT:     Head: Normocephalic and atraumatic.     Right Ear: Tympanic membrane, ear canal and external ear normal.     Left Ear:  Tympanic membrane, ear canal and external ear normal.     Nose: No rhinorrhea.     Mouth/Throat:     Pharynx: No oropharyngeal exudate or posterior oropharyngeal erythema.  Eyes:     Conjunctiva/sclera: Conjunctivae normal.     Pupils: Pupils are equal, round, and reactive to light.  Neck:   Cardiovascular:     Rate and Rhythm: Normal rate and regular rhythm.  Heart sounds: Normal heart sounds. No murmur heard. Pulmonary:     Effort: Pulmonary effort is normal.     Breath sounds: Normal breath sounds. No wheezing, rhonchi or rales.  Abdominal:     Palpations: Abdomen is soft.     Tenderness: There is no abdominal tenderness.  Musculoskeletal:     Cervical back: Neck supple.  Lymphadenopathy:     Cervical: No cervical adenopathy.  Skin:    General: Skin is warm and dry.     Findings: No rash.  Neurological:     Mental Status: He is alert and oriented to person, place, and time.  Psychiatric:        Behavior: Behavior normal.        Assessment & Plan:  Bryce Polacek. is a 57 y.o. male . Upper respiratory tract infection, unspecified type  -Improving, probable viral illness, differential includes COVID but improving, afebrile today off antibiotics yesterday.  Less likely contagious at this time.  Reassuring labs, RMSF titer still pending but with other respiratory symptoms unlikely cause of prior headache, myalgias, chills.  Symptomatic care discussed, Tessalon Perles as needed for cough and azelastine nasal spray for rhinorrhea.  RTC precautions.  Abnormal neck finding - Plan: US Soft Tissue Head/Neck (NON-THYROID)  -Firm areas still present bilaterally, question lymphadenopathy versus palpable hyoid bone.  Check ultrasound.  Asymptomatic.  It appears too high for thyroid although does have family history of Graves' disease in his brother.  Further workup to be determined by initial ultrasound.  Microscopic hematuria - Plan: Ambulatory referral to Urology  -Reports her  symptoms in the past but has not been evaluated by urology, asymptomatic with urine culture negative, has not had recurrence of urinary frequency.  Refer to hematology to decide on further evaluation for hematuria. Rhinorrhea - Plan: azelastine (ASTELIN) 0.1 % nasal spray  Acute cough - Plan: benzonatate (TESSALON PERLES) 100 MG capsule   Meds ordered this encounter  Medications   benzonatate (TESSALON PERLES) 100 MG capsule    Sig: Take 1 capsule (100 mg total) by mouth 3 (three) times daily as needed for cough.    Dispense:  20 capsule    Refill:  0   azelastine (ASTELIN) 0.1 % nasal spray    Sig: Place 1-2 sprays into both nostrils 2 (two) times daily as needed for rhinitis. Use in each nostril as directed    Dispense:  30 mL    Refill:  3   Patient Instructions  Glad to hear you are feeling better.  I suspect you had a virus as we discussed.  Labs were overall reassuring.  I prescribed a pill that you can take 3 times per day as needed for cough, or Mucinex over-the-counter.  Nasal spray 1 to 2 sprays in each nostril up to twice per day for runny nose if needed.  Tylenol if needed for body aches or headache but glad that has improved.  If you are fever free for 24 hours off of fever reducing medicines like Tylenol or Advil, it is thought that you are less contagious at that time.  Follow-up if any worsening symptoms.  I do still feel the firm areas underneath your neck.  That could be part of the normal anatomy of your neck but I would like to check an ultrasound to make sure.  They will call you to schedule that test.  I will also refer you to the urologist to discuss the blood in the urine and other recommended  testing.  Return to the clinic or go to the nearest emergency room if any of your symptoms worsen or new symptoms occur.     Signed,   Meredith Staggers, MD [facility], [facility] 08/16/22 10:06 AM

## 2022-08-16 NOTE — Patient Instructions (Signed)
Glad to hear you are feeling better.  I suspect you had a virus as we discussed.  Labs were overall reassuring.  I prescribed a pill that you can take 3 times per day as needed for cough, or Mucinex over-the-counter.  Nasal spray 1 to 2 sprays in each nostril up to twice per day for runny nose if needed.  Tylenol if needed for body aches or headache but glad that has improved.  If you are fever free for 24 hours off of fever reducing medicines like Tylenol or Advil, it is thought that you are less contagious at that time.  Follow-up if any worsening symptoms.  I do still feel the firm areas underneath your neck.  That could be part of the normal anatomy of your neck but I would like to check an ultrasound to make sure.  They will call you to schedule that test.  I will also refer you to the urologist to discuss the blood in the urine and other recommended testing.  Return to the clinic or go to the nearest emergency room if any of your symptoms worsen or new symptoms occur.

## 2022-08-23 ENCOUNTER — Ambulatory Visit (HOSPITAL_BASED_OUTPATIENT_CLINIC_OR_DEPARTMENT_OTHER)
Admission: RE | Admit: 2022-08-23 | Discharge: 2022-08-23 | Disposition: A | Discharge status: Home / Self Care | Payer: 59 | Source: Ambulatory Visit | Attending: Family Medicine | Admitting: Family Medicine

## 2022-08-23 DIAGNOSIS — R6889 Other general symptoms and signs: Secondary | ICD-10-CM | POA: Diagnosis present

## 2022-08-30 ENCOUNTER — Encounter: Payer: Self-pay | Admitting: Urology

## 2022-08-30 ENCOUNTER — Ambulatory Visit: Payer: 59 | Admitting: Urology

## 2022-08-30 VITALS — BP 155/90 | HR 83 | Ht 68.0 in | Wt 219.0 lb

## 2022-08-30 DIAGNOSIS — R319 Hematuria, unspecified: Secondary | ICD-10-CM

## 2022-08-30 DIAGNOSIS — R350 Frequency of micturition: Secondary | ICD-10-CM | POA: Diagnosis not present

## 2022-08-30 DIAGNOSIS — N486 Induration penis plastica: Secondary | ICD-10-CM | POA: Diagnosis not present

## 2022-08-30 LAB — BLADDER SCAN AMB NON-IMAGING
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
## 2022-09-02 ENCOUNTER — Telehealth: Payer: Self-pay | Admitting: Family Medicine

## 2022-09-02 LAB — URINALYSIS, ROUTINE W REFLEX MICROSCOPIC
Bilirubin, UA: NEGATIVE
Glucose, UA: NEGATIVE
Ketones, UA: NEGATIVE
Leukocytes,UA: NEGATIVE
Nitrite, UA: NEGATIVE
Protein,UA: NEGATIVE
Specific Gravity, UA: 1.025 (ref 1.005–1.030)
Urobilinogen, Ur: 0.2 mg/dL (ref 0.2–1.0)
pH, UA: 6 (ref 5.0–7.5)

## 2022-09-02 LAB — MICROSCOPIC EXAMINATION
Cast Type: NONE SEEN
Casts: NONE SEEN /lpf
Crystal Type: NONE SEEN
Crystals: NONE SEEN
Renal Epithel, UA: NONE SEEN /hpf
Trichomonas, UA: NONE SEEN
Yeast, UA: NONE SEEN

## 2022-09-02 NOTE — Telephone Encounter (Signed)
Lisinopril was sent in 06/14/2022 for 90 day and 1 refill. Called pt to make him aware of this and he was going to call [facility] pharmacy.

## 2022-09-02 NOTE — Telephone Encounter (Signed)
Patient has called to inform us that in order for his insurance to cover lisinopril (ZESTRIL) 40 MG tablet  he would need a 90 day supply instead of 30 day supply. Please advise.

## 2022-09-06 ENCOUNTER — Ambulatory Visit: Payer: 59 | Admitting: Family Medicine

## 2022-09-06 ENCOUNTER — Encounter: Payer: Self-pay | Admitting: Family Medicine

## 2022-09-06 VITALS — BP 122/74 | HR 66 | Temp 98.3°F | Ht 68.0 in | Wt 222.8 lb

## 2022-09-06 DIAGNOSIS — E1169 Type 2 diabetes mellitus with other specified complication: Secondary | ICD-10-CM | POA: Diagnosis not present

## 2022-09-06 DIAGNOSIS — Z7984 Long term (current) use of oral hypoglycemic drugs: Secondary | ICD-10-CM

## 2022-09-06 DIAGNOSIS — R5383 Other fatigue: Secondary | ICD-10-CM | POA: Diagnosis not present

## 2022-09-06 DIAGNOSIS — E669 Obesity, unspecified: Secondary | ICD-10-CM

## 2022-09-06 DIAGNOSIS — E782 Mixed hyperlipidemia: Secondary | ICD-10-CM | POA: Diagnosis not present

## 2022-09-06 DIAGNOSIS — I10 Essential (primary) hypertension: Secondary | ICD-10-CM

## 2022-09-06 DIAGNOSIS — R351 Nocturia: Secondary | ICD-10-CM

## 2022-09-06 DIAGNOSIS — Z6834 Body mass index (BMI) 34.0-34.9, adult: Secondary | ICD-10-CM

## 2022-09-06 LAB — COMPREHENSIVE METABOLIC PANEL
ALT: 19 U/L (ref 0–53)
AST: 16 U/L (ref 0–37)
Albumin: 4.3 g/dL (ref 3.5–5.2)
Alkaline Phosphatase: 61 U/L (ref 39–117)
BUN: 14 mg/dL (ref 6–23)
CO2: 28 mEq/L (ref 19–32)
Calcium: 9.3 mg/dL (ref 8.4–10.5)
Chloride: 102 mEq/L (ref 96–112)
Creatinine, Ser: 0.85 mg/dL (ref 0.40–1.50)
GFR: 96.21 mL/min (ref >60.00)
Glucose, Bld: 86 mg/dL (ref 70–99)
Potassium: 4.4 mEq/L (ref 3.5–5.1)
Sodium: 138 mEq/L (ref 135–145)
Total Bilirubin: 0.7 mg/dL (ref 0.2–1.2)
Total Protein: 7.3 g/dL (ref 6.0–8.3)

## 2022-09-06 LAB — LIPID PANEL
Cholesterol: 145 mg/dL (ref 0–200)
HDL: 50 mg/dL (ref >39.00)
LDL Cholesterol: 69 mg/dL (ref 0–99)
NonHDL: 95.31
Total CHOL/HDL Ratio: 3
Triglycerides: 134 mg/dL (ref 0.0–149.0)
VLDL: 26.8 mg/dL (ref 0.0–40.0)

## 2022-09-06 LAB — HEMOGLOBIN A1C: Hgb A1c MFr Bld: 6.1 % (ref 4.6–6.5)

## 2022-09-06 LAB — TSH: TSH: 0.96 u[IU]/mL (ref 0.35–5.50)

## 2022-09-06 MED ORDER — OZEMPIC (0.25 OR 0.5 MG/DOSE) 2 MG/3ML ~~LOC~~ SOPN
0.5000 mg | PEN_INJECTOR | SUBCUTANEOUS | 2 refills | Status: DC
Start: 2022-09-06 — End: 2022-11-25

## 2022-09-06 MED ORDER — ATORVASTATIN CALCIUM 40 MG PO TABS: 40.0000 mg | ORAL_TABLET | Freq: Every day | ORAL | 1 refills | Status: DC

## 2022-09-06 MED ORDER — AMLODIPINE BESYLATE 5 MG PO TABS: 5.0000 mg | ORAL_TABLET | Freq: Every day | ORAL | 1 refills | Status: DC

## 2022-09-06 MED ORDER — LISINOPRIL 40 MG PO TABS: 40.0000 mg | ORAL_TABLET | Freq: Every day | ORAL | 1 refills | Status: DC
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
## 2022-09-06 NOTE — Patient Instructions (Addendum)
I do recommend trying a slight higher dose of Ozempic, 0.5 mg once per week.- let me know if side effects and we could return to the 0.25 mg dose.  Recheck levels in 3 months as well as weight at that time.  Sleep schedule could be contributing to fatigue, but with nighttime urination and some fatigue I would consider sleep study as we discussed.  Let me know if you change your mind and I can send a referral without an office visit.  No other med changes today.  Take care.

## 2022-09-24 ENCOUNTER — Other Ambulatory Visit: Payer: Self-pay

## 2022-09-24 DIAGNOSIS — R319 Hematuria, unspecified: Secondary | ICD-10-CM

## 2022-09-30 ENCOUNTER — Other Ambulatory Visit: Payer: 59

## 2022-09-30 DIAGNOSIS — R319 Hematuria, unspecified: Secondary | ICD-10-CM

## 2022-10-01 ENCOUNTER — Encounter: Payer: Self-pay | Admitting: Urology

## 2022-11-07 ENCOUNTER — Encounter (INDEPENDENT_AMBULATORY_CARE_PROVIDER_SITE_OTHER): Payer: Self-pay

## 2022-11-24 ENCOUNTER — Other Ambulatory Visit: Payer: Self-pay | Admitting: Family Medicine

## 2022-11-24 DIAGNOSIS — E669 Obesity, unspecified: Secondary | ICD-10-CM

## 2022-11-24 DIAGNOSIS — E1169 Type 2 diabetes mellitus with other specified complication: Secondary | ICD-10-CM

## 2022-12-13 ENCOUNTER — Ambulatory Visit: Payer: 59 | Admitting: Family Medicine

## 2022-12-13 ENCOUNTER — Encounter: Payer: Self-pay | Admitting: Family Medicine

## 2022-12-13 VITALS — BP 130/80 | HR 70 | Temp 98.6°F | Wt 222.0 lb

## 2022-12-13 DIAGNOSIS — E669 Obesity, unspecified: Secondary | ICD-10-CM | POA: Diagnosis not present

## 2022-12-13 DIAGNOSIS — E1169 Type 2 diabetes mellitus with other specified complication: Secondary | ICD-10-CM

## 2022-12-13 DIAGNOSIS — I10 Essential (primary) hypertension: Secondary | ICD-10-CM

## 2022-12-13 DIAGNOSIS — Z23 Encounter for immunization: Secondary | ICD-10-CM | POA: Diagnosis not present

## 2022-12-13 DIAGNOSIS — F439 Reaction to severe stress, unspecified: Secondary | ICD-10-CM

## 2022-12-13 DIAGNOSIS — Z7985 Long-term (current) use of injectable non-insulin antidiabetic drugs: Secondary | ICD-10-CM

## 2022-12-13 DIAGNOSIS — Z6833 Body mass index (BMI) 33.0-33.9, adult: Secondary | ICD-10-CM

## 2022-12-13 LAB — BASIC METABOLIC PANEL
BUN: 15 mg/dL (ref 6–23)
CO2: 25 mEq/L (ref 19–32)
Calcium: 9.5 mg/dL (ref 8.4–10.5)
Chloride: 102 mEq/L (ref 96–112)
Creatinine, Ser: 0.96 mg/dL (ref 0.40–1.50)
GFR: 87.35 mL/min (ref >60.00)
Glucose, Bld: 78 mg/dL (ref 70–99)
Potassium: 4.5 mEq/L (ref 3.5–5.1)
Sodium: 137 mEq/L (ref 135–145)

## 2022-12-13 LAB — HEMOGLOBIN A1C: Hgb A1c MFr Bld: 5.9 % (ref 4.6–6.5)

## 2022-12-13 NOTE — Progress Notes (Signed)
Subjective:  Patient ID: Bryce Hopes., male    DOB: 1965/10/27  Age: 57 y.o. MRN: [id number]  CC:  Chief Complaint  Patient presents with   Medical Management of Chronic Issues    Yes to FLU vax. No further questions/concerns.     HPI Bryce Bush. presents for   Diabetes:  With obesity, prior prediabetes.  Diabetic level A1c last year.  Has been treated with Ozempic, initially started in April.  He is on ACE inhibitor and statin.  Weight is stable from his last visit.  Dosage was recommended to increase to 0.5 mg of Ozempic at his last visit.  We also discussed nighttime urination and fatigue and recommended sleep study which was declined at that visit. Sleeping better, no daytime sleepiness.  No home blood sugar readings.  No n/v/abd pain with higher dose ozempic.  Some stress eating. Work is stress. Considering other options.  Some sweets - cutting back.  Denies depression/SI. Trying to get out  Microalbumin: 03/21/22.  Optho, foot exam, pneumovax:  Foot exam today.  At end of today's visit noted he has occasional soreness on the left knee, left leg but intermittent, no mechanical symptoms of locking giving way or specific injury and plans to follow-up to discuss further if persistent or worsening. Diabetic Foot Exam - Simple   Simple Foot Form Visual Inspection No deformities, no ulcerations, no other skin breakdown bilaterally: Yes Sensation Testing Intact to touch and monofilament testing bilaterally: Yes Pulse Check Posterior Tibialis and Dorsalis pulse intact bilaterally: Yes Comments     Wt Readings from Last 3 Encounters:  12/13/22 222 lb (100.7 kg)  09/06/22 222 lb 12.8 oz (101.1 kg)  08/30/22 219 lb (99.3 kg)  Body mass index is 33.75 kg/m.   Lab Results  Component Value Date   HGBA1C 6.1 09/06/2022   HGBA1C 6.0 (A) 07/05/2022   HGBA1C 6.6 (H) 03/21/2022   Lab Results  Component Value Date   MICROALBUR 0.9 03/21/2022   LDLCALC 69 09/06/2022    CREATININE 0.85 09/06/2022     History Patient Active Problem List   Diagnosis Date Noted   Hematuria 08/30/2022   Urinary frequency 08/30/2022   Peyronie's disease 08/30/2022   Metabolic syndrome 10/16/2021   Type 2 diabetes mellitus with obesity (HCC) 10/16/2021   Family history of heart disease 10/16/2021   Class 1 obesity without serious comorbidity with body mass index (BMI) of 34.0 to 34.9 in adult 12/22/2016   Essential hypertension 03/11/2012   Mixed hyperlipidemia 03/11/2012   Past Medical History:  Diagnosis Date   Class 1 obesity due to excess calories with serious comorbidity and body mass index (BMI) of 32.0 to 32.9 in adult 12/22/2016   Diabetes mellitus type 2 in obese 10/16/2021   Essential hypertension 03/11/2012   Family history of heart disease 10/16/2021   GERD (gastroesophageal reflux disease)    Hypercholesteremia    Hypertension    Metabolic syndrome 10/16/2021   Mixed hyperlipidemia 03/11/2012   Past Surgical History:  Procedure Laterality Date   APPENDECTOMY     motor vechile     SPINE SURGERY     Cervical spine surgery   TONSILLECTOMY     No Known Allergies Prior to Admission medications   Medication Sig Start Date End Date Taking? Authorizing Provider  amLODipine (NORVASC) 5 MG tablet Take 1 tablet (5 mg total) by mouth daily. 09/06/22  Yes Shade Flood, MD  aspirin 81 MG chewable tablet Chew 81 mg by  mouth daily.   Yes [provider]  atorvastatin (LIPITOR) 40 MG tablet Take 1 tablet (40 mg total) by mouth daily. 09/06/22  Yes Shade Flood, MD  azelastine (ASTELIN) 0.1 % nasal spray Place 1-2 sprays into both nostrils 2 (two) times daily as needed for rhinitis. Use in each nostril as directed 08/16/22  Yes Shade Flood, MD  fish oil-omega-3 fatty acids 1000 MG capsule Take 1 g by mouth daily.   Yes [provider]  lisinopril (ZESTRIL) 40 MG tablet Take 1 tablet (40 mg total) by mouth daily. 09/06/22  Yes Shade Flood, MD  LYSINE PO Take 1 tablet by mouth daily.   Yes [provider]  OZEMPIC, 0.25 OR 0.5 MG/DOSE, 2 MG/3ML SOPN INJECT 0.5MG  INTO THE SKIN ONCE A WEEK 11/25/22  Yes Shade Flood, MD  traMADol (ULTRAM) 50 MG tablet Take 50 mg by mouth every 4 (four) hours. 04/19/22  Yes [provider]   Social History   Socioeconomic History   Marital status: Single    Spouse name: Not on file   Number of children: 2   Years of education: Not on file   Highest education level: Not on file  Occupational History   Occupation: truck driver    Comment: [employer]  Tobacco Use   Smoking status: Never   Smokeless tobacco: Never  Substance and Sexual Activity   Alcohol use: No   Drug use: No   Sexual activity: Yes  Other Topics Concern   Not on file  Social History Narrative   Marital status: Single; not dating      Children: 2 children/adopted.  No grandchildren.      Education: [employer].      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      Tobacco: none      Alcohol: none      Drugs: none      Exercise:  Weight room in house; lifts weights.      Seatbelt: 100%; no texting      Sexual activity: none in past year; no STDs.  Females only.           Social Determinants of Health   Financial Resource Strain: Low Risk  (10/16/2021)   Overall Financial Resource Strain (CARDIA)    Difficulty of Paying Living Expenses: Not hard at all  Food Insecurity: No Food Insecurity (10/16/2021)   Hunger Vital Sign    Worried About Running Out of Food in the Last Year: Never true    Ran Out of Food in the Last Year: Never true  Transportation Needs: No Transportation Needs (10/16/2021)   PRAPARE - Administrator, Civil Service (Medical): No    Lack of Transportation (Non-Medical): No  Physical Activity: Inactive (10/16/2021)   Exercise Vital Sign    Days of Exercise per Week: 0 days    Minutes of Exercise per Session: 0 min   Stress: Not on file  Social Connections: Unknown (08/07/2021)   [facility] from [facility], [facility]   Social Network    Social Network: Not on file  Intimate Partner Violence: Unknown (06/29/2021)   Received from [facility], [facility]   HITS    Physically Hurt: Not on file    Insult or Talk Down To: Not on file    Threaten Physical Harm: Not on file    Scream or Curse: Not on  file    Review of Systems  Constitutional:  Negative for fatigue and unexpected weight change.  Eyes:  Negative for visual disturbance.  Respiratory:  Negative for cough, chest tightness and shortness of breath.   Cardiovascular:  Negative for chest pain, palpitations and leg swelling.  Gastrointestinal:  Negative for abdominal pain and blood in stool.  Neurological:  Negative for dizziness, light-headedness and headaches.     Objective:   Vitals:   12/13/22 1006  BP: 130/80  Pulse: 70  Temp: 98.6 F (37 C)  SpO2: 98%  Weight: 222 lb (100.7 kg)     Physical Exam Vitals reviewed.  Constitutional:      Appearance: He is well-developed.  HENT:     Head: Normocephalic and atraumatic.  Neck:     Vascular: No carotid bruit or JVD.  Cardiovascular:     Rate and Rhythm: Normal rate and regular rhythm.     Heart sounds: Normal heart sounds. No murmur heard. Pulmonary:     Effort: Pulmonary effort is normal.     Breath sounds: Normal breath sounds. No rales.  Musculoskeletal:     Right lower leg: No edema.     Left lower leg: No edema.  Skin:    General: Skin is warm and dry.  Neurological:     Mental Status: He is alert and oriented to person, place, and time.  Psychiatric:        Mood and Affect: Mood normal.        Assessment & Plan:  Bryce Moschella. is a 57 y.o. male . Type 2 diabetes mellitus with obesity (HCC) - Plan: Basic metabolic panel, Hemoglobin A1c  -Weight stable, likely related to stress eating.  Tolerating current meds.  Handout given on stress management.   Check labs, adjust plan accordingly.  31-month follow-up.  Need for influenza vaccination - Plan: Flu vaccine trivalent PF, 6mos and older(Flulaval,Afluria,Fluarix,Fluzone)  Essential hypertension  -Stable on current regimen.  No med changes.  Class 1 obesity with serious comorbidity and body mass index (BMI) of 33.0 to 33.9 in adult, unspecified obesity type Situational stress  -As above, handout given on stress management.  RTC precautions.  As above noted intermittent left leg, knee discomfort but asymptomatic at this time, no trauma.  Plans to follow-up if persistent symptoms or worsening.  No orders of the defined types were placed in this encounter.  Patient Instructions  See info on stress below to help with lessening stress eating. Let me know if I can help further.  I will check labs today. No med changes for now.         Signed,   Meredith Staggers, MD [facility], [facility] 12/13/22 11:17 AM

## 2022-12-13 NOTE — Patient Instructions (Addendum)
See info on stress below to help with lessening stress eating. Let me know if I can help further.  I will check labs today. No med changes for now.  If any continued leg pain please follow-up to discuss further and exam.  Be seen sooner if any worsening symptoms.   Managing Stress, Adult Feeling a certain amount of stress is normal. Stress helps our body and mind get ready to deal with the demands of life. Stress hormones can motivate you to do well at work and meet your responsibilities. But severe or long-term (chronic) stress can affect your mental and physical health. Chronic stress puts you at higher risk for: Anxiety and depression. Other health problems such as digestive problems, muscle aches, heart disease, high blood pressure, and stroke. What are the causes? Common causes of stress include: Demands from work, such as deadlines, feeling overworked, or having long hours. Pressures at home, such as money issues, disagreements with a spouse, or parenting issues. Pressures from major life changes, such as divorce, moving, loss of a loved one, or chronic illness. You may be at higher risk for stress-related problems if you: Do not get enough sleep. Are in poor health. Do not have emotional support. Have a mental health disorder such as anxiety or depression. How to recognize stress Stress can make you: Have trouble sleeping. Feel sad, anxious, irritable, or overwhelmed. Lose your appetite. Overeat or want to eat unhealthy foods. Want to use drugs or alcohol. Stress can also cause physical symptoms, such as: Sore, tense muscles, especially in the shoulders and neck. Headaches. Trouble breathing. A faster heart rate. Stomach pain, nausea, or vomiting. Diarrhea or constipation. Trouble concentrating. Follow these instructions at home: Eating and drinking Eat a healthy diet. This includes: Eating foods that are high in fiber, such as beans, whole grains, and fresh fruits and  vegetables. Limiting foods that are high in fat and processed sugars, such as fried or sweet foods. Do not skip meals or overeat. Drink enough fluid to keep your urine pale yellow. Alcohol use Do not drink alcohol if: Your health care provider tells you not to drink. You are pregnant, may be pregnant, or are planning to become pregnant. Drinking alcohol is a way some people try to ease their stress. This can be dangerous, so if you drink alcohol: Limit how much you have to: 0-1 drink a day for women. 0-2 drinks a day for men. Know how much alcohol is in your drink. In the [place] one drink equals one 12 oz bottle of beer (355 mL), one 5 oz glass of wine (148 mL), or one 1 oz glass of hard liquor (44 mL). Activity  Include 30 minutes of exercise in your daily schedule. Exercise is a good stress reducer. Include time in your day for an activity that you find relaxing. Try taking a walk, going on a bike ride, reading a book, or listening to music. Schedule your time in a way that lowers stress, and keep a regular schedule. Focus on doing what is most important to get done. Lifestyle Identify the source of your stress and your reaction to it. See a therapist who can help you change unhelpful reactions. When there are stressful events: Talk about them with family, friends, or coworkers. Try to think realistically about stressful events and not ignore them or overreact. Try to find the positives in a stressful situation and not focus on the negatives. Cut back on responsibilities at work and home, if possible. Ask  for help from friends or family members if you need it. Find ways to manage stress, such as: Mindfulness, meditation, or deep breathing. Yoga or tai chi. Progressive muscle relaxation. Spending time in nature. Doing art, playing music, or reading. Making time for fun activities. Spending time with family and friends. Get support from family, friends, or spiritual  resources. General instructions Get enough sleep. Try to go to sleep and get up at about the same time every day. Take over-the-counter and prescription medicines only as told by your health care provider. Do not use any products that contain nicotine or tobacco. These products include cigarettes, chewing tobacco, and vaping devices, such as e-cigarettes. If you need help quitting, ask your health care provider. Do not use drugs or smoke to deal with stress. Keep all follow-up visits. This is important. Where to find support Talk with your health care provider about stress management or finding a support group. Find a therapist to work with you on your stress management techniques. Where to find more information [employer] on Mental Illness: www.nami.org American Psychological Association: [employer] Contact a health care provider if: Your stress symptoms get worse. You are unable to manage your stress at home. You are struggling to stop using drugs or alcohol. Get help right away if: You may be a danger to yourself or others. You have any thoughts of death or suicide. Get help right awayif you feel like you may hurt yourself or others, or have thoughts about taking your own life. Go to your nearest emergency room or: Call 911. Call the National Suicide Prevention Lifeline at (587)817-0773 or 988 in the [place] This is open 24 hours a day. Text the Crisis Text Line at 318-696-9820. Summary Feeling a certain amount of stress is normal, but severe or long-term (chronic) stress can affect your mental and physical health. Chronic stress can put you at higher risk for anxiety, depression, and other health problems such as digestive problems, muscle aches, heart disease, high blood pressure, and stroke. You may be at higher risk for stress-related problems if you do not get enough sleep, are in poor health, lack emotional support, or have a mental health disorder such as anxiety or  depression. Identify the source of your stress and your reaction to it. Try talking about stressful events with family, friends, or coworkers, finding a coping method, or getting support from spiritual resources. If you need more help, talk with your health care provider about finding a support group or a mental health therapist. This information is not intended to replace advice given to you by your health care provider. Make sure you discuss any questions you have with your health care provider. Document Revised: 10/05/2020 Document Reviewed: 10/03/2020 Elsevier Patient Education  2024 [employer].

## 2023-01-08 LAB — URINALYSIS, ROUTINE W REFLEX MICROSCOPIC
Bilirubin, UA: NEGATIVE
Glucose, UA: NEGATIVE
Ketones, UA: NEGATIVE
Leukocytes,UA: NEGATIVE
Nitrite, UA: NEGATIVE
Protein,UA: NEGATIVE
Specific Gravity, UA: 1.03 (ref 1.005–1.030)
Urobilinogen, Ur: 0.2 mg/dL (ref 0.2–1.0)
pH, UA: 5.5 (ref 5.0–7.5)

## 2023-01-08 LAB — MICROSCOPIC EXAMINATION
Cast Type: NONE SEEN
Casts: NONE SEEN /[LPF]
Crystal Type: NONE SEEN
Crystals: NONE SEEN
Epithelial Cells (non renal): NONE SEEN /[HPF] (ref 0–10)
Renal Epithel, UA: NONE SEEN /[HPF]
Trichomonas, UA: NONE SEEN
WBC, UA: NONE SEEN /[HPF] (ref 0–5)
Yeast, UA: NONE SEEN

## 2023-02-15 ENCOUNTER — Other Ambulatory Visit: Payer: Self-pay | Admitting: Family Medicine

## 2023-02-15 DIAGNOSIS — Z6834 Body mass index (BMI) 34.0-34.9, adult: Secondary | ICD-10-CM

## 2023-02-15 DIAGNOSIS — E1169 Type 2 diabetes mellitus with other specified complication: Secondary | ICD-10-CM

## 2023-02-17 NOTE — Telephone Encounter (Signed)
Requested Prescriptions   Pending Prescriptions Disp Refills   OZEMPIC, 0.25 OR 0.5 MG/DOSE, 2 MG/3ML SOPN [Pharmacy Med Name: Ozempic (0.25 or 0.5 MG/DOSE) 2 MG/3ML Subcutaneous Solution Pen-injector] 9 mL 0    Sig: INJECT 0.5MG  SUBCUTANEOUSLY ONCE A WEEK     Date of patient request: 02/17/23 12/13/2022 03/14/2023 Date of last refill: 11/25/2022 Last refill amount: 9mL

## 2023-02-17 NOTE — Telephone Encounter (Signed)
Stable A1c at his September visit, Ozempic refilled.

## 2023-02-27 ENCOUNTER — Other Ambulatory Visit: Payer: Self-pay | Admitting: Family Medicine

## 2023-02-27 DIAGNOSIS — I10 Essential (primary) hypertension: Secondary | ICD-10-CM

## 2023-03-14 ENCOUNTER — Ambulatory Visit: Payer: 59 | Admitting: Family Medicine

## 2023-03-14 ENCOUNTER — Encounter: Payer: Self-pay | Admitting: Family Medicine

## 2023-03-14 VITALS — BP 130/74 | HR 79 | Temp 98.9°F | Ht 68.0 in | Wt 222.2 lb

## 2023-03-14 DIAGNOSIS — E1169 Type 2 diabetes mellitus with other specified complication: Secondary | ICD-10-CM | POA: Diagnosis not present

## 2023-03-14 DIAGNOSIS — E782 Mixed hyperlipidemia: Secondary | ICD-10-CM | POA: Diagnosis not present

## 2023-03-14 DIAGNOSIS — F439 Reaction to severe stress, unspecified: Secondary | ICD-10-CM | POA: Diagnosis not present

## 2023-03-14 DIAGNOSIS — E669 Obesity, unspecified: Secondary | ICD-10-CM

## 2023-03-14 DIAGNOSIS — I10 Essential (primary) hypertension: Secondary | ICD-10-CM | POA: Diagnosis not present

## 2023-03-14 DIAGNOSIS — Z7985 Long-term (current) use of injectable non-insulin antidiabetic drugs: Secondary | ICD-10-CM

## 2023-03-14 LAB — COMPREHENSIVE METABOLIC PANEL
ALT: 18 U/L (ref 0–53)
AST: 16 U/L (ref 0–37)
Albumin: 4.5 g/dL (ref 3.5–5.2)
Alkaline Phosphatase: 60 U/L (ref 39–117)
BUN: 15 mg/dL (ref 6–23)
CO2: 29 meq/L (ref 19–32)
Calcium: 9.6 mg/dL (ref 8.4–10.5)
Chloride: 104 meq/L (ref 96–112)
Creatinine, Ser: 0.84 mg/dL (ref 0.40–1.50)
GFR: 96.2 mL/min (ref >60.00)
Glucose, Bld: 96 mg/dL (ref 70–99)
Potassium: 4.4 meq/L (ref 3.5–5.1)
Sodium: 141 meq/L (ref 135–145)
Total Bilirubin: 0.7 mg/dL (ref 0.2–1.2)
Total Protein: 7.2 g/dL (ref 6.0–8.3)

## 2023-03-14 LAB — MICROALBUMIN / CREATININE URINE RATIO
Creatinine,U: 174.1 mg/dL
Microalb Creat Ratio: 0.4 mg/g (ref 0.0–30.0)
Microalb, Ur: 0.8 mg/dL (ref 0.0–1.9)

## 2023-03-14 LAB — LIPID PANEL
Cholesterol: 155 mg/dL (ref 0–200)
HDL: 49.5 mg/dL (ref >39.00)
LDL Cholesterol: 81 mg/dL (ref 0–99)
NonHDL: 105.13
Total CHOL/HDL Ratio: 3
Triglycerides: 119 mg/dL (ref 0.0–149.0)
VLDL: 23.8 mg/dL (ref 0.0–40.0)

## 2023-03-14 LAB — HEMOGLOBIN A1C: Hgb A1c MFr Bld: 6.1 % (ref 4.6–6.5)

## 2023-03-14 MED ORDER — SEMAGLUTIDE (1 MG/DOSE) 4 MG/3ML ~~LOC~~ SOPN: 1.0000 mg | PEN_INJECTOR | SUBCUTANEOUS | 2 refills | Status: DC

## 2023-03-14 MED ORDER — AMLODIPINE BESYLATE 5 MG PO TABS: 5.0000 mg | ORAL_TABLET | Freq: Every day | ORAL | 0 refills | Status: DC

## 2023-03-14 MED ORDER — ATORVASTATIN CALCIUM 40 MG PO TABS: 40.0000 mg | ORAL_TABLET | Freq: Every day | ORAL | 1 refills | Status: DC

## 2023-03-14 MED ORDER — LISINOPRIL 40 MG PO TABS: 40.0000 mg | ORAL_TABLET | Freq: Every day | ORAL | 1 refills | Status: DC
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
## 2023-03-14 NOTE — Patient Instructions (Signed)
Try increasing Ozempic for next dose - return to 0.5mg  dose. Let me know if new side effects. No other changes for now.  Hang in there. Please let me know if we need to look at other ways to manage the stressors. Happy to help.

## 2023-04-18 ENCOUNTER — Encounter: Payer: Self-pay | Admitting: Family Medicine

## 2023-04-18 ENCOUNTER — Ambulatory Visit: Payer: 59 | Admitting: Family Medicine

## 2023-04-18 DIAGNOSIS — Z7985 Long-term (current) use of injectable non-insulin antidiabetic drugs: Secondary | ICD-10-CM

## 2023-04-18 DIAGNOSIS — E1169 Type 2 diabetes mellitus with other specified complication: Secondary | ICD-10-CM | POA: Diagnosis not present

## 2023-04-18 DIAGNOSIS — E669 Obesity, unspecified: Secondary | ICD-10-CM | POA: Diagnosis not present

## 2023-04-18 NOTE — Patient Instructions (Signed)
Thanks for coming in today.  Continue 1 mg of Ozempic at this time.  If you are having difficulty with weight loss or we need to go up on the dose prior to next visit, let me know but otherwise recheck in 2 months and repeat labs at that time.  Hang in there!
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
## 2023-06-20 ENCOUNTER — Ambulatory Visit: Payer: 59 | Admitting: Family Medicine

## 2023-06-20 ENCOUNTER — Telehealth: Payer: Self-pay | Admitting: Family Medicine

## 2023-06-20 ENCOUNTER — Ambulatory Visit (INDEPENDENT_AMBULATORY_CARE_PROVIDER_SITE_OTHER)
Admission: RE | Admit: 2023-06-20 | Discharge: 2023-06-20 | Disposition: A | Discharge status: Home / Self Care | Source: Ambulatory Visit | Attending: Family Medicine | Admitting: Family Medicine

## 2023-06-20 VITALS — BP 136/74 | HR 86 | Temp 99.2°F | Ht 68.0 in | Wt 219.4 lb

## 2023-06-20 DIAGNOSIS — E669 Obesity, unspecified: Secondary | ICD-10-CM

## 2023-06-20 DIAGNOSIS — E1169 Type 2 diabetes mellitus with other specified complication: Secondary | ICD-10-CM | POA: Diagnosis not present

## 2023-06-20 DIAGNOSIS — I10 Essential (primary) hypertension: Secondary | ICD-10-CM

## 2023-06-20 DIAGNOSIS — M25562 Pain in left knee: Secondary | ICD-10-CM

## 2023-06-20 DIAGNOSIS — B001 Herpesviral vesicular dermatitis: Secondary | ICD-10-CM | POA: Diagnosis not present

## 2023-06-20 LAB — COMPREHENSIVE METABOLIC PANEL WITH GFR
ALT: 19 U/L (ref 0–53)
AST: 16 U/L (ref 0–37)
Albumin: 4.5 g/dL (ref 3.5–5.2)
Alkaline Phosphatase: 62 U/L (ref 39–117)
BUN: 17 mg/dL (ref 6–23)
CO2: 28 meq/L (ref 19–32)
Calcium: 9.3 mg/dL (ref 8.4–10.5)
Chloride: 104 meq/L (ref 96–112)
Creatinine, Ser: 0.87 mg/dL (ref 0.40–1.50)
GFR: 95.01 mL/min (ref >60.00)
Glucose, Bld: 89 mg/dL (ref 70–99)
Potassium: 4.7 meq/L (ref 3.5–5.1)
Sodium: 138 meq/L (ref 135–145)
Total Bilirubin: 0.6 mg/dL (ref 0.2–1.2)
Total Protein: 7.4 g/dL (ref 6.0–8.3)

## 2023-06-20 LAB — HEMOGLOBIN A1C: Hgb A1c MFr Bld: 5.7 % (ref 4.6–6.5)

## 2023-06-20 MED ORDER — AMLODIPINE BESYLATE 5 MG PO TABS: 5.0000 mg | ORAL_TABLET | Freq: Every day | ORAL | 0 refills | Status: DC

## 2023-06-20 MED ORDER — OZEMPIC (0.25 OR 0.5 MG/DOSE) 2 MG/3ML ~~LOC~~ SOPN
2.0000 mg | PEN_INJECTOR | SUBCUTANEOUS | 3 refills | Status: DC
Start: 2023-06-20 — End: 2023-06-25

## 2023-06-20 MED ORDER — VALACYCLOVIR HCL 1 G PO TABS: 2000.0000 mg | ORAL_TABLET | Freq: Two times a day (BID) | ORAL | 5 refills | Status: AC

## 2023-06-20 NOTE — Progress Notes (Signed)
 Subjective:  Patient ID: Hyman Hopes., male    DOB: May 23, 1965  Age: 58 y.o. MRN: [id number]  CC:  Chief Complaint  Patient presents with   Medical Management of Chronic Issues    Pt notes doing okay, notes Bp is a bit higher recently probably stress related per patient    Knee Pain    Pt notes still having dull ache in his Lt knee has mentioned once before and was advised to bring it up again if it persists    Mouth Lesions    Has long history of cold sores wondered if he could have Valtrex to help with these.      HPI Denise Bramblett. presents for multiple concerns above   Diabetes: With obesity.  Commended on dietary changes and weight loss, tolerating 1 mg Ozempic at his January 24 visit.  Weight overall stable since last visit, up 1 pound today. Weight plateau - 214-216 at home.  Only brief nausea if hungry, improves.  No abd pain, neck swelling, constipation or vomiting.  Home readings: none No symptomatic lows. He is on statin with Lipitor 40 mg daily, ACE inhibitor with lisinopril 40 mg daily.  Microalbumin: nl ratio 03/14/2023.  Optho, foot exam, pneumovax:  Optho - new glasses and exam in December.  Shingrix, Pneumonia vaccine - declined. Declines hep c screen.   Wt Readings from Last 3 Encounters:  06/20/23 219 lb 6.4 oz (99.5 kg)  04/18/23 218 lb 6.4 oz (99.1 kg)  03/14/23 222 lb 3.2 oz (100.8 kg)    Lab Results  Component Value Date   HGBA1C 6.1 03/14/2023   HGBA1C 5.9 12/13/2022   HGBA1C 6.1 09/06/2022   Lab Results  Component Value Date   MICROALBUR 0.8 03/14/2023   LDLCALC 81 03/14/2023   CREATININE 0.84 03/14/2023   Hypertension: Amlodipine 5 mg daily, lisinopril 40 mg daily. No side effects.  Home readings: BP Readings from Last 3 Encounters:  06/20/23 136/74  04/18/23 138/80  03/14/23 130/74   Lab Results  Component Value Date   CREATININE 0.84 03/14/2023   Left knee pain Intermittent. Dry ache in bed at times. Sore with squat then  stand. No locking giving way or swelling. No limitations - just sore to squat. No recent change in activity or injury - slip on truck over a year ago. Sore since that time.  Tx: occasional ibuprofen - some relief.   History of cold sores Longstanding symptoms.  No current medications.  Occurs once per year. No rx meds. Has tried Abreva.    History Patient Active Problem List   Diagnosis Date Noted   Hematuria 08/30/2022   Urinary frequency 08/30/2022   Peyronie's disease 08/30/2022   Metabolic syndrome 10/16/2021   Type 2 diabetes mellitus with obesity (HCC) 10/16/2021   Family history of heart disease 10/16/2021   Class 1 obesity without serious comorbidity with body mass index (BMI) of 34.0 to 34.9 in adult 12/22/2016   Essential hypertension 03/11/2012   Mixed hyperlipidemia 03/11/2012   Past Medical History:  Diagnosis Date   Class 1 obesity due to excess calories with serious comorbidity and body mass index (BMI) of 32.0 to 32.9 in adult 12/22/2016   Diabetes mellitus type 2 in obese 10/16/2021   Essential hypertension 03/11/2012   Family history of heart disease 10/16/2021   GERD (gastroesophageal reflux disease)    Hypercholesteremia    Hypertension    Metabolic syndrome 10/16/2021   Mixed hyperlipidemia 03/11/2012   Past Surgical  History:  Procedure Laterality Date   APPENDECTOMY     motor vechile     SPINE SURGERY     Cervical spine surgery   TONSILLECTOMY     No Known Allergies Prior to Admission medications   Medication Sig Start Date End Date Taking? Authorizing Provider  amLODipine (NORVASC) 5 MG tablet Take 1 tablet (5 mg total) by mouth daily. 03/14/23  Yes Shade Flood, MD  aspirin 81 MG chewable tablet Chew 81 mg by mouth daily.   Yes [provider]  atorvastatin (LIPITOR) 40 MG tablet Take 1 tablet (40 mg total) by mouth daily. 03/14/23  Yes Shade Flood, MD  azelastine (ASTELIN) 0.1 % nasal spray Place 1-2 sprays into both nostrils 2  (two) times daily as needed for rhinitis. Use in each nostril as directed 08/16/22  Yes Shade Flood, MD  fish oil-omega-3 fatty acids 1000 MG capsule Take 1 g by mouth daily.   Yes [provider]  lisinopril (ZESTRIL) 40 MG tablet Take 1 tablet (40 mg total) by mouth daily. 03/14/23  Yes Shade Flood, MD  LYSINE PO Take 1 tablet by mouth daily.   Yes [provider]  Semaglutide, 1 MG/DOSE, 4 MG/3ML SOPN Inject 1 mg as directed once a week. 03/14/23  Yes Shade Flood, MD  traMADol (ULTRAM) 50 MG tablet Take 50 mg by mouth every 4 (four) hours. 04/19/22  Yes [provider]   Social History   Socioeconomic History   Marital status: Single    Spouse name: Not on file   Number of children: 2   Years of education: Not on file   Highest education level: Not on file  Occupational History   Occupation: truck driver    Comment: [employer]  Tobacco Use   Smoking status: Never   Smokeless tobacco: Never  Substance and Sexual Activity   Alcohol use: No   Drug use: No   Sexual activity: Yes  Other Topics Concern   Not on file  Social History Narrative   Marital status: Single; not dating      Children: 2 children/adopted.  No grandchildren.      Education: [employer].      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      Tobacco: none      Alcohol: none      Drugs: none      Exercise:  Weight room in house; lifts weights.      Seatbelt: 100%; no texting      Sexual activity: none in past year; no STDs.  Females only.           Social Drivers of Corporate investment banker Strain: Low Risk  (10/16/2021)   Overall Financial Resource Strain (CARDIA)    Difficulty of Paying Living Expenses: Not hard at all  Food Insecurity: No Food Insecurity (10/16/2021)   Hunger Vital Sign    Worried About Running Out of Food in the Last Year: Never true    Ran Out of Food in the Last Year: Never true  Transportation  Needs: No Transportation Needs (10/16/2021)   PRAPARE - Administrator, Civil Service (Medical): No    Lack of Transportation (Non-Medical): No  Physical Activity: Inactive (10/16/2021)   Exercise Vital Sign    Days of Exercise per Week: 0 days    Minutes of Exercise per Session: 0 min  Stress: Not on file  Social Connections: Unknown (08/07/2021)   Received from [facility], [facility]   Social Network    Social Network: Not on file  Intimate Partner Violence: Unknown (06/29/2021)   Received from [facility], [facility]   HITS    Physically Hurt: Not on file    Insult or Talk Down To: Not on file    Threaten Physical Harm: Not on file    Scream or Curse: Not on file    Review of Systems   Objective:   Vitals:   06/20/23 1028  BP: 136/74  Pulse: 86  Temp: 99.2 F (37.3 C)  TempSrc: Temporal  SpO2: 99%  Weight: 219 lb 6.4 oz (99.5 kg)  Height: 5\' 8"  (1.727 m)     Physical Exam Vitals reviewed.  Constitutional:      Appearance: He is well-developed.  HENT:     Head: Normocephalic and atraumatic.     Mouth/Throat:     Comments: Patch with vesicles at left angle of mouth.  No surrounding erythema or discharge.  Some crusting over vesicles. Neck:     Vascular: No carotid bruit or JVD.  Cardiovascular:     Rate and Rhythm: Normal rate and regular rhythm.     Heart sounds: Normal heart sounds. No murmur heard. Pulmonary:     Effort: Pulmonary effort is normal.     Breath sounds: Normal breath sounds. No rales.  Musculoskeletal:     Right lower leg: No edema.     Left lower leg: No edema.     Comments: Left knee, full range of motion, no erythema, no ecchymosis, no effusion appreciated.  Minimal discomfort at medial joint line with McMurray testing but otherwise nontender.  Negative varus/valgus stress testing.  Ambulating without assistive device.  Skin:    General: Skin is warm and dry.  Neurological:     Mental Status: He is alert and oriented  to person, place, and time.  Psychiatric:        Mood and Affect: Mood normal.        Assessment & Plan:  Hudson Majkowski. is a 58 y.o. male . Type 2 diabetes mellitus with obesity (HCC) - Plan: Comprehensive metabolic panel with GFR, Hemoglobin A1c, Semaglutide,0.25 or 0.5MG /DOS, (OZEMPIC, 0.25 OR 0.5 MG/DOSE,) 2 MG/3ML SOPN  -A1c obtained, will try slight higher dose of Ozempic to see if that may be more helpful with weight loss.  Potential side effects discussed with RTC precautions given.  Essential hypertension - Plan: amLODipine (NORVASC) 5 MG tablet  -Stable with current regimen, continue same.  Left knee pain, unspecified chronicity - Plan: DG Knee 1-2 Views Left  -Imaging obtained without acute findings.  Mild patellofemoral osteoarthritis.  Symptomatic care with option to meet with Ortho.  Cold sore - Plan: valACYclovir (VALTREX) 1000 MG tablet  -Current flare over 4 to 5 days, no acute treatment for now but did write Valtrex for 2 g dosing every 12 hours x 2 doses with onset of symptoms in the future.  Meds ordered this encounter  Medications   amLODipine (NORVASC) 5 MG tablet    Sig: Take 1 tablet (5 mg total) by mouth daily.    Dispense:  90 tablet    Refill:  0   Semaglutide,0.25 or 0.5MG /DOS, (OZEMPIC, 0.25 OR 0.5 MG/DOSE,) 2 MG/3ML SOPN    Sig: Inject 2 mg into the skin once a week.    Dispense:  12 mL    Refill:  3   valACYclovir (VALTREX) 1000 MG tablet    Sig: Take 2 tablets (2,000 mg total) by mouth 2 (two) times daily for 2 doses.    Dispense:  4 tablet    Refill:  5   Patient Instructions  We can try higher dose of Ozempic and see if that helps with weight loss.  I will check labs today and let you know if there are any concerns.  Recheck in 3 months if current dose tolerated, follow-up sooner if any new concerns or side effects with the higher dose.  Valtrex 2 pills initially at any signs or symptoms of cold sore starting, with repeat dose of 2 pills in  12 hours.  No further dosages needed per flare.  If you have more frequent flares, let me know.  Since current flare started 4 days ago, I would not take Valtrex at this time.  You could have some arthritis or other cause of the left knee pain.  Tylenol over-the-counter or topical Voltaren gel over-the-counter is safer than ibuprofen.  Please have x-ray at the [facility] location below and depending on those results and your symptoms can decide if orthopedic evaluation needed.  Keep me posted.   [facility] or xray: Walk in 8:30-4:30 during weekdays, no appointment needed [address]  [place], [place] [address]     Signed,   Meredith Staggers, MD [facility], [facility] 06/20/23 10:37 AM

## 2023-06-20 NOTE — Patient Instructions (Signed)
 We can try higher dose of Ozempic and see if that helps with weight loss.  I will check labs today and let you know if there are any concerns.  Recheck in 3 months if current dose tolerated, follow-up sooner if any new concerns or side effects with the higher dose.  Valtrex 2 pills initially at any signs or symptoms of cold sore starting, with repeat dose of 2 pills in 12 hours.  No further dosages needed per flare.  If you have more frequent flares, let me know.  Since current flare started 4 days ago, I would not take Valtrex at this time.  You could have some arthritis or other cause of the left knee pain.  Tylenol over-the-counter or topical Voltaren gel over-the-counter is safer than ibuprofen.  Please have x-ray at the [facility] location below and depending on those results and your symptoms can decide if orthopedic evaluation needed.  Keep me posted.   [facility] or xray: Walk in 8:30-4:30 during weekdays, no appointment needed [address]  [place], [place] [address]

## 2023-06-20 NOTE — Telephone Encounter (Signed)
 Copied from CRM [id number]. Topic: Clinical - Prescription Issue >> Jun 20, 2023 12:13 PM Saverio Danker wrote: Reason for CRM: Bryce Bush is calling in from [facility] pharmacy about the Semaglutide,0.25 or 0.5MG /DOS, (OZEMPIC, 0.25 OR 0.5 MG/DOSE,) 2 MG/3ML SOPN.  The patient was prescribed the 2mg  dosage and with the doctors prescription that was sent over is not going to work for the patient. She stated with what is prescribed the prescription needs to be sent with the 8mg  dosage instead  Bryce Bush- can call back [phone]

## 2023-06-20 NOTE — Telephone Encounter (Signed)
 This was a quantity was wrong and this has now been corrected

## 2023-06-21 ENCOUNTER — Encounter: Payer: Self-pay | Admitting: Family Medicine

## 2023-06-25 ENCOUNTER — Telehealth: Payer: Self-pay

## 2023-06-25 DIAGNOSIS — E1169 Type 2 diabetes mellitus with other specified complication: Secondary | ICD-10-CM

## 2023-06-25 MED ORDER — OZEMPIC (2 MG/DOSE) 8 MG/3ML ~~LOC~~ SOPN: 2.0000 mg | PEN_INJECTOR | SUBCUTANEOUS | 2 refills | Status: DC

## 2023-06-25 NOTE — Telephone Encounter (Signed)
Is this okay to change?

## 2023-06-25 NOTE — Telephone Encounter (Signed)
 Copied from CRM [id number]. Topic: Clinical - Prescription Issue >> Jun 20, 2023 12:13 PM Saverio Danker wrote: Reason for CRM: Lynden Ang is calling in from [facility] pharmacy about the Semaglutide,0.25 or 0.5MG /DOS, (OZEMPIC, 0.25 OR 0.5 MG/DOSE,) 2 MG/3ML SOPN.  The patient was prescribed the 2mg  dosage and with the doctors prescription that was sent over is not going to work for the patient. She stated with what is prescribed the prescription needs to be sent with the 8mg  dosage instead  Lynden Ang- can call back [phone] >> Jun 25, 2023 10:42 AM [place] J wrote: Patient is calling regarding an issue with his insurance company approving medication. Insurance company states that a verbal approval of dosage from provider is needed for the medication.  [phone]

## 2023-06-25 NOTE — Telephone Encounter (Signed)
 Patient has been made aware of update.

## 2023-06-25 NOTE — Telephone Encounter (Signed)
 New dose ordered as 8mg /36ml. Thanks.

## 2023-07-14 ENCOUNTER — Other Ambulatory Visit (HOSPITAL_COMMUNITY): Payer: Self-pay

## 2023-08-19 ENCOUNTER — Other Ambulatory Visit (HOSPITAL_COMMUNITY): Payer: Self-pay

## 2023-08-19 ENCOUNTER — Telehealth: Payer: Self-pay

## 2023-08-19 NOTE — Telephone Encounter (Signed)
 Pharmacy Patient Advocate Encounter   Received notification from CoverMyMeds that prior authorization for Ozempic  8 is required/requested.   Unable to verify insurance. Patient media has [employer] card from 9/24.     Per test claim: need updated insurance information to continue with Prior authorization.

## 2023-08-21 ENCOUNTER — Other Ambulatory Visit (HOSPITAL_COMMUNITY): Payer: Self-pay

## 2023-08-21 ENCOUNTER — Telehealth: Payer: Self-pay

## 2023-08-21 NOTE — Telephone Encounter (Signed)
 Copied from CRM [id number]. Topic: Clinical - Medication Prior Auth >> Aug 21, 2023  1:06 PM Albertha Alosa wrote: Reason for BJY:NWGNFAO [facility] called in regarding a urgent prior auth for the prescription  Ozempic  8  [id number]

## 2023-08-21 NOTE — Telephone Encounter (Signed)
 Pharmacy Patient Advocate Encounter   Received notification from CoverMyMeds that prior authorization for Ozempic  8 is required/requested.   Insurance verification completed.   The patient is insured through [employer] .   Per test claim: PA required; PA submitted to above mentioned insurance via CoverMyMeds Key/confirmation #/EOC [employer] Status is pending

## 2023-08-22 ENCOUNTER — Other Ambulatory Visit (HOSPITAL_COMMUNITY): Payer: Self-pay

## 2023-08-22 NOTE — Telephone Encounter (Signed)
 Called patient to relay this medication was approved. 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 Insurance requires him to do a 90 day supply.

## 2023-08-22 NOTE — Telephone Encounter (Signed)
 Pharmacy Patient Advocate Encounter  Received notification from [facility] that Prior Authorization for Ozempic  8 has been APPROVED from 08/21/23 to 08/20/24. Unable to obtain price due to refill too soon rejection, last fill date 08/21/23 next available fill date8/14/25. Must be filled at [facility] or [facility].   PA #/Case ID/Reference #: [id number]

## 2023-08-22 NOTE — Telephone Encounter (Signed)
 This has been submitted and waiting on return see other phone note

## 2023-10-03 ENCOUNTER — Ambulatory Visit: Payer: Self-pay | Admitting: Family Medicine

## 2023-10-03 ENCOUNTER — Ambulatory Visit: Admitting: Family Medicine

## 2023-10-03 ENCOUNTER — Encounter: Payer: Self-pay | Admitting: Family Medicine

## 2023-10-03 VITALS — BP 128/82 | HR 66 | Ht 68.0 in | Wt 215.2 lb

## 2023-10-03 DIAGNOSIS — E669 Obesity, unspecified: Secondary | ICD-10-CM

## 2023-10-03 DIAGNOSIS — I10 Essential (primary) hypertension: Secondary | ICD-10-CM

## 2023-10-03 DIAGNOSIS — E1169 Type 2 diabetes mellitus with other specified complication: Secondary | ICD-10-CM | POA: Diagnosis not present

## 2023-10-03 DIAGNOSIS — E782 Mixed hyperlipidemia: Secondary | ICD-10-CM | POA: Diagnosis not present

## 2023-10-03 DIAGNOSIS — L989 Disorder of the skin and subcutaneous tissue, unspecified: Secondary | ICD-10-CM | POA: Diagnosis not present

## 2023-10-03 DIAGNOSIS — Z7985 Long-term (current) use of injectable non-insulin antidiabetic drugs: Secondary | ICD-10-CM

## 2023-10-03 DIAGNOSIS — Z1159 Encounter for screening for other viral diseases: Secondary | ICD-10-CM

## 2023-10-03 DIAGNOSIS — Z85828 Personal history of other malignant neoplasm of skin: Secondary | ICD-10-CM

## 2023-10-03 LAB — LIPID PANEL
Cholesterol: 151 mg/dL (ref 0–200)
HDL: 56.3 mg/dL (ref >39.00)
LDL Cholesterol: 68 mg/dL (ref 0–99)
NonHDL: 94.54
Total CHOL/HDL Ratio: 3
Triglycerides: 134 mg/dL (ref 0.0–149.0)
VLDL: 26.8 mg/dL (ref 0.0–40.0)

## 2023-10-03 LAB — COMPREHENSIVE METABOLIC PANEL WITH GFR
ALT: 20 U/L (ref 0–53)
AST: 16 U/L (ref 0–37)
Albumin: 4.5 g/dL (ref 3.5–5.2)
Alkaline Phosphatase: 56 U/L (ref 39–117)
BUN: 13 mg/dL (ref 6–23)
CO2: 30 meq/L (ref 19–32)
Calcium: 9.2 mg/dL (ref 8.4–10.5)
Chloride: 104 meq/L (ref 96–112)
Creatinine, Ser: 0.83 mg/dL (ref 0.40–1.50)
GFR: 96.18 mL/min (ref >60.00)
Glucose, Bld: 86 mg/dL (ref 70–99)
Potassium: 4.6 meq/L (ref 3.5–5.1)
Sodium: 138 meq/L (ref 135–145)
Total Bilirubin: 0.8 mg/dL (ref 0.2–1.2)
Total Protein: 7.3 g/dL (ref 6.0–8.3)

## 2023-10-03 LAB — MICROALBUMIN / CREATININE URINE RATIO
Creatinine,U: 164.6 mg/dL
Microalb Creat Ratio: 5.6 mg/g (ref 0.0–30.0)
Microalb, Ur: 0.9 mg/dL (ref 0.0–1.9)

## 2023-10-03 LAB — HEMOGLOBIN A1C: Hgb A1c MFr Bld: 5.6 % (ref 4.6–6.5)

## 2023-10-03 MED ORDER — AMLODIPINE BESYLATE 5 MG PO TABS: 5.0000 mg | ORAL_TABLET | Freq: Every day | ORAL | 1 refills | Status: DC

## 2023-10-03 MED ORDER — ATORVASTATIN CALCIUM 40 MG PO TABS: 40.0000 mg | ORAL_TABLET | Freq: Every day | ORAL | 1 refills | Status: DC

## 2023-10-03 MED ORDER — OZEMPIC (2 MG/DOSE) 8 MG/3ML ~~LOC~~ SOPN: 2.0000 mg | PEN_INJECTOR | SUBCUTANEOUS | 2 refills | Status: DC

## 2023-10-03 NOTE — Patient Instructions (Signed)
 Very sorry to hear about your mom.  Please reach out to us  and let us  know if we can provide any resources during this difficult time.  Hang in there.  No change in Ozempic  dose or other medication doses for now.  If you notice a plateau or difficulty with continued weight loss, let me know as we have some other options.  Will follow-up in 6 months if everything is stable.  I will refill meds today as well as check labs and let you know if there are any concerns.  I will refer you to dermatology for the area of the scalp.  Hang in there and let me know if there are questions.

## 2023-10-03 NOTE — Progress Notes (Signed)
 Subjective:  Patient ID: Bryce Bush., male    DOB: 1965/10/23  Age: 58 y.o. MRN: [id number]  CC:  Chief Complaint  Patient presents with   Medical Management of Chronic Issues    Pt is here for 3 month med mgnt Pt would like a referral to Dermatologist- he reports discolored spot on left side of forehead.      HPI Emmanuell Kantz. presents for  Diabetes: With obesity, weight has improved, commended on weight loss.  Treated with Ozempic , and on statin with Lipitor, ACE inhibitor with lisinopril .  Slight higher dose of Ozempic  at 2 mg ordered at his March visit. No new side effects, no n/v/abd pain/neck swelling/constipation.  Microalbumin: Normal ratio 03/14/2023. Optho, foot exam, pneumovax: Has declined Shingrix and pneumonia vaccine.  Has declined hep C screening. Optho exam in December last year with new glasses. Hypertension discussed in March. No new side effects or myalgias with meds, statin.   Lab Results  Component Value Date   HGBA1C 5.7 06/20/2023   HGBA1C 6.1 03/14/2023   HGBA1C 5.9 12/13/2022   Lab Results  Component Value Date   LDLCALC 81 03/14/2023   CREATININE 0.87 06/20/2023   Wt Readings from Last 3 Encounters:  10/03/23 215 lb 4 oz (97.6 kg)  06/20/23 219 lb 6.4 oz (99.5 kg)  04/18/23 218 lb 6.4 oz (99.1 kg)   Lesion of face/forehead: Past 6 months, bleeding with cleaning face at times. Has tried using aquaphor. Has helped.  Prior derm eval and skin cancer removed form chin - years ago. No recent derm eval and has not discussed this area.    Situational stress Mom passed 6 weeks ago.  Doing ok. Has good support system, denies need for additional resources. Trying to stay busy.    History Patient Active Problem List   Diagnosis Date Noted   Hematuria 08/30/2022   Urinary frequency 08/30/2022   Peyronie's disease 08/30/2022   Metabolic syndrome 10/16/2021   Type 2 diabetes mellitus with obesity (HCC) 10/16/2021   Family history of heart  disease 10/16/2021   Class 1 obesity without serious comorbidity with body mass index (BMI) of 34.0 to 34.9 in adult 12/22/2016   Essential hypertension 03/11/2012   Mixed hyperlipidemia 03/11/2012   Past Medical History:  Diagnosis Date   Class 1 obesity due to excess calories with serious comorbidity and body mass index (BMI) of 32.0 to 32.9 in adult 12/22/2016   Diabetes mellitus type 2 in obese 10/16/2021   Essential hypertension 03/11/2012   Family history of heart disease 10/16/2021   GERD (gastroesophageal reflux disease)    Hypercholesteremia    Hypertension    Metabolic syndrome 10/16/2021   Mixed hyperlipidemia 03/11/2012   Past Surgical History:  Procedure Laterality Date   APPENDECTOMY     motor vechile     SPINE SURGERY     Cervical spine surgery   TONSILLECTOMY     No Known Allergies Prior to Admission medications   Medication Sig Start Date End Date Taking? Authorizing Provider  amLODipine  (NORVASC ) 5 MG tablet Take 1 tablet (5 mg total) by mouth daily. 06/20/23  Yes Levora Reyes SAUNDERS, MD  aspirin 81 MG chewable tablet Chew 81 mg by mouth daily.   Yes [provider]  atorvastatin  (LIPITOR) 40 MG tablet Take 1 tablet (40 mg total) by mouth daily. 03/14/23  Yes Levora Reyes SAUNDERS, MD  fish oil-omega-3 fatty acids 1000 MG capsule Take 1 g by mouth daily.   Yes  [provider]  lisinopril  (ZESTRIL ) 40 MG tablet Take 1 tablet (40 mg total) by mouth daily. 03/14/23  Yes Levora Reyes SAUNDERS, MD  LYSINE PO Take 1 tablet by mouth daily.   Yes [provider]  Semaglutide , 2 MG/DOSE, (OZEMPIC , 2 MG/DOSE,) 8 MG/3ML SOPN Inject 2 mg into the skin once a week. 06/25/23  Yes Levora Reyes SAUNDERS, MD  traMADol (ULTRAM) 50 MG tablet Take 50 mg by mouth every 4 (four) hours. 04/19/22  Yes [provider]  azelastine  (ASTELIN ) 0.1 % nasal spray Place 1-2 sprays into both nostrils 2 (two) times daily as needed for rhinitis. Use in each nostril as  directed Patient not taking: Reported on 10/03/2023 08/16/22   Levora Reyes SAUNDERS, MD   Social History   Socioeconomic History   Marital status: Single    Spouse name: Not on file   Number of children: 2   Years of education: Not on file   Highest education level: Not on file  Occupational History   Occupation: truck driver    Comment: [employer]  Tobacco Use   Smoking status: Never   Smokeless tobacco: Never  Substance and Sexual Activity   Alcohol use: No   Drug use: No   Sexual activity: Yes  Other Topics Concern   Not on file  Social History Narrative   Marital status: Single; not dating      Children: 2 children/adopted.  No grandchildren.      Education: [employer].      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      Tobacco: none      Alcohol: none      Drugs: none      Exercise:  Weight room in house; lifts weights.      Seatbelt: 100%; no texting      Sexual activity: none in past year; no STDs.  Females only.           Social Drivers of Corporate investment banker Strain: Low Risk  (10/16/2021)   Overall Financial Resource Strain (CARDIA)    Difficulty of Paying Living Expenses: Not hard at all  Food Insecurity: No Food Insecurity (10/16/2021)   Hunger Vital Sign    Worried About Running Out of Food in the Last Year: Never true    Ran Out of Food in the Last Year: Never true  Transportation Needs: No Transportation Needs (10/16/2021)   PRAPARE - Administrator, Civil Service (Medical): No    Lack of Transportation (Non-Medical): No  Physical Activity: Inactive (10/16/2021)   Exercise Vital Sign    Days of Exercise per Week: 0 days    Minutes of Exercise per Session: 0 min  Stress: Not on file  Social Connections: Unknown (08/07/2021)   [facility]   Social Network    Social Network: Not on file  Intimate Partner Violence: Unknown (06/29/2021)   Received from [facility]   HITS     Physically Hurt: Not on file    Insult or Talk Down To: Not on file    Threaten Physical Harm: Not on file    Scream or Curse: Not on file    Review of Systems  Constitutional:  Negative for fatigue and unexpected weight change.  Eyes:  Negative for visual disturbance.  Respiratory:  Negative for cough, chest tightness and shortness of breath.   Cardiovascular:  Negative for chest  pain, palpitations and leg swelling.  Gastrointestinal:  Negative for abdominal pain and blood in stool.  Neurological:  Negative for dizziness, light-headedness and headaches.     Objective:   Vitals:   10/03/23 0947  BP: 128/82  Pulse: 66  SpO2: 98%  Weight: 215 lb 4 oz (97.6 kg)  Height: 5' 8 (1.727 m)     Physical Exam Vitals reviewed.  Constitutional:      Appearance: He is well-developed.  HENT:     Head: Normocephalic and atraumatic.  Neck:     Vascular: No carotid bruit or JVD.  Cardiovascular:     Rate and Rhythm: Normal rate and regular rhythm.     Heart sounds: Normal heart sounds. No murmur heard. Pulmonary:     Effort: Pulmonary effort is normal.     Breath sounds: Normal breath sounds. No rales.  Musculoskeletal:     Right lower leg: No edema.     Left lower leg: No edema.  Skin:    General: Skin is warm and dry.  Neurological:     Mental Status: He is alert and oriented to person, place, and time.  Psychiatric:        Mood and Affect: Mood normal.         Assessment & Plan:  Sourish Allender. is a 58 y.o. male . Type 2 diabetes mellitus with obesity (HCC) - Plan: Urine Microalbumin w/creat. ratio, Comprehensive metabolic panel with GFR, Hemoglobin A1c, Semaglutide , 2 MG/DOSE, (OZEMPIC , 2 MG/DOSE,) 8 MG/3ML SOPN  - Tolerating current dose of Ozempic , will continue same, check labs and adjust plan accordingly.  Commended on weight loss, if plateaus can consider change to different agent but no changes for now.  Essential hypertension - Plan: amLODipine  (NORVASC ) 5  MG tablet  - Stable with current regimen, continue same.  Labs as above.  Mixed hyperlipidemia - Plan: atorvastatin  (LIPITOR) 40 MG tablet, Comprehensive metabolic panel with GFR, Lipid panel  - Tolerating Lipitor, continue same, check labs and adjust accordingly.  Face lesion History of skin cancer - Plan: Ambulatory referral to Dermatology  - Reports some improvement with topical Aquaphor but with history of skin cancer and persistent facial lesion will refer to dermatology for evaluation and possible biopsy.  Need for hepatitis C screening test - Plan: Hepatitis C Antibody   Meds ordered this encounter  Medications   amLODipine  (NORVASC ) 5 MG tablet    Sig: Take 1 tablet (5 mg total) by mouth daily.    Dispense:  90 tablet    Refill:  1   atorvastatin  (LIPITOR) 40 MG tablet    Sig: Take 1 tablet (40 mg total) by mouth daily.    Dispense:  90 tablet    Refill:  1   Semaglutide , 2 MG/DOSE, (OZEMPIC , 2 MG/DOSE,) 8 MG/3ML SOPN    Sig: Inject 2 mg into the skin once a week.    Dispense:  3 mL    Refill:  2   Patient Instructions  Very sorry to hear about your mom.  Please reach out to us  and let us  know if we can provide any resources during this difficult time.  Hang in there.  No change in Ozempic  dose or other medication doses for now.  If you notice a plateau or difficulty with continued weight loss, let me know as we have some other options.  Will follow-up in 6 months if everything is stable.  I will refill meds today as well as check labs and  let you know if there are any concerns.  I will refer you to dermatology for the area of the scalp.  Hang in there and let me know if there are questions.      Signed,   Reyes Pines, MD [facility], [facility] 10/03/23 10:57 AM

## 2023-10-04 LAB — HEPATITIS C ANTIBODY: Hepatitis C Ab: NONREACTIVE

## 2024-01-29 ENCOUNTER — Other Ambulatory Visit: Payer: Self-pay | Admitting: Family Medicine

## 2024-01-29 DIAGNOSIS — I10 Essential (primary) hypertension: Secondary | ICD-10-CM

## 2024-04-23 ENCOUNTER — Ambulatory Visit: Admitting: Family Medicine

## 2024-04-23 ENCOUNTER — Encounter: Payer: Self-pay | Admitting: Family Medicine

## 2024-04-23 VITALS — BP 110/74 | HR 62 | Temp 98.7°F | Resp 19 | Ht 68.0 in | Wt 216.0 lb

## 2024-04-23 DIAGNOSIS — I10 Essential (primary) hypertension: Secondary | ICD-10-CM

## 2024-04-23 DIAGNOSIS — Z23 Encounter for immunization: Secondary | ICD-10-CM | POA: Diagnosis not present

## 2024-04-23 DIAGNOSIS — E782 Mixed hyperlipidemia: Secondary | ICD-10-CM

## 2024-04-23 DIAGNOSIS — Z0000 Encounter for general adult medical examination without abnormal findings: Secondary | ICD-10-CM | POA: Diagnosis not present

## 2024-04-23 DIAGNOSIS — Z7985 Long-term (current) use of injectable non-insulin antidiabetic drugs: Secondary | ICD-10-CM

## 2024-04-23 DIAGNOSIS — E669 Obesity, unspecified: Secondary | ICD-10-CM | POA: Diagnosis not present

## 2024-04-23 DIAGNOSIS — E119 Type 2 diabetes mellitus without complications: Secondary | ICD-10-CM | POA: Diagnosis not present

## 2024-04-23 LAB — COMPREHENSIVE METABOLIC PANEL WITH GFR
ALT: 20 U/L (ref 3–53)
AST: 19 U/L (ref 5–37)
Albumin: 4.6 g/dL (ref 3.5–5.2)
Alkaline Phosphatase: 56 U/L (ref 39–117)
BUN: 16 mg/dL (ref 6–23)
CO2: 30 meq/L (ref 19–32)
Calcium: 9.5 mg/dL (ref 8.4–10.5)
Chloride: 103 meq/L (ref 96–112)
Creatinine, Ser: 0.86 mg/dL (ref 0.40–1.50)
GFR: 94.78 mL/min
Glucose, Bld: 108 mg/dL — ABNORMAL HIGH (ref 70–99)
Potassium: 4.4 meq/L (ref 3.5–5.1)
Sodium: 140 meq/L (ref 135–145)
Total Bilirubin: 0.7 mg/dL (ref 0.2–1.2)
Total Protein: 7.5 g/dL (ref 6.0–8.3)

## 2024-04-23 LAB — LIPID PANEL
Cholesterol: 179 mg/dL (ref 28–200)
HDL: 56 mg/dL
LDL Cholesterol: 90 mg/dL (ref 10–99)
NonHDL: 122.86
Total CHOL/HDL Ratio: 3
Triglycerides: 162 mg/dL — ABNORMAL HIGH (ref 10.0–149.0)
VLDL: 32.4 mg/dL (ref 0.0–40.0)

## 2024-04-23 LAB — HEMOGLOBIN A1C: Hgb A1c MFr Bld: 5.8 % (ref 4.6–6.5)

## 2024-04-23 LAB — MICROALBUMIN / CREATININE URINE RATIO
Creatinine,U: 143.5 mg/dL
Microalb Creat Ratio: 5.3 mg/g (ref 0.0–30.0)
Microalb, Ur: 0.8 mg/dL (ref 0.7–1.9)

## 2024-04-23 MED ORDER — ATORVASTATIN CALCIUM 40 MG PO TABS: 40.0000 mg | ORAL_TABLET | Freq: Every day | ORAL | 1 refills | Status: AC

## 2024-04-23 MED ORDER — AMLODIPINE BESYLATE 5 MG PO TABS: 5.0000 mg | ORAL_TABLET | Freq: Every day | ORAL | 1 refills | Status: AC

## 2024-04-23 MED ORDER — OZEMPIC (2 MG/DOSE) 8 MG/3ML ~~LOC~~ SOPN: 2.0000 mg | PEN_INJECTOR | SUBCUTANEOUS | 2 refills | Status: AC

## 2024-04-23 MED ORDER — LISINOPRIL 40 MG PO TABS: 40.0000 mg | ORAL_TABLET | Freq: Every day | ORAL | 0 refills | Status: AC

## 2024-04-23 NOTE — Progress Notes (Signed)
 "  Subjective:  Patient ID: Bryce Bush., male    DOB: 1965-12-21  Age: 59 y.o. MRN: [id number]  CC:  Chief Complaint  Patient presents with   Annual Exam    Pharmacy would not refill ozepmic. Last shot was 2 weeks ago    HPI [name] presents for Annual Exam  No concerns or change in health.   PCP, [facility], Dr. Roseann, history of hematuria, Peyronie's.  Repeat urinalysis without evidence of microscopic hematuria, deferred further evaluation, 3 to 76-month follow-up based on note from July 2024. Discussed recommended follow up, but he notes was told to follow up as needed.  Gastroenterology, Dr. Teressa, endoscopy, colonoscopy in November 2022..  Endoscopy was for dysphagia.  Normal stomach esophagus and examined duodenum.  Daily omeprazole .  Lesion removed during colonoscopy, nonprecancerous polyps, repeat colonoscopy in 10 years. Dermatology - [facility] - few weeks ago had bx of face, ear - no concerns  Optho - recent visit in December. new glasses correction to help prevent eye from drifting.   Diabetes: With obesity, last discussed in July.  Weight had improved, treated with Ozempic , on statin with Lipitor, ACE inhibitor with lisinopril .  He was doing well at the 2 mg weekly dose of Ozempic . Denies nausea, vomiting, abdominal pain, constipation, neck swelling or other side effects. Last dose of ozempic  2 weeks ago - out of it, and told that can not fill until February. No extra doses or lost rx.  Home readings - none.  No sx's of lows.  Microalbumin: Normal ratio 03/14/2023. Optho, foot exam, pneumovax:  Has declined Shingrix, pneumonia vaccine and hepatitis C screening. Flu vaccine today.    Lab Results  Component Value Date   HGBA1C 5.6 10/03/2023   HGBA1C 5.7 06/20/2023   HGBA1C 6.1 03/14/2023   Lab Results  Component Value Date   MICROALBUR 0.9 10/03/2023   LDLCALC 68 10/03/2023   CREATININE 0.83 10/03/2023   Wt Readings from Last 3 Encounters:   04/23/24 216 lb (98 kg)  10/03/23 215 lb 4 oz (97.6 kg)  06/20/23 219 lb 6.4 oz (99.5 kg)   Hypertension: Amlodipine  5 mg daily, lisinopril  40 mg daily, no side effects with meds.  Home readings: BP Readings from Last 3 Encounters:  04/23/24 110/74  10/03/23 128/82  06/20/23 136/74   Lab Results  Component Value Date   CREATININE 0.83 10/03/2023    Hyperlipidemia: Lipitor 40 mg daily without any myalgias or side effects. Lab Results  Component Value Date   CHOL 151 10/03/2023   HDL 56.30 10/03/2023   LDLCALC 68 10/03/2023   LDLDIRECT 130.0 03/21/2022   TRIG 134.0 10/03/2023   CHOLHDL 3 10/03/2023   Lab Results  Component Value Date   ALT 20 10/03/2023   AST 16 10/03/2023   ALKPHOS 56 10/03/2023   BILITOT 0.8 10/03/2023      Staying busy, doing ok since mom passed last May and best friend the year before.     04/23/2024   10:08 AM 10/03/2023    9:43 AM 06/20/2023   10:24 AM 03/14/2023    9:17 AM 09/06/2022   10:11 AM  Depression screen PHQ 2/9  Decreased Interest 0 0 0 0 0  Down, Depressed, Hopeless 0 0 0 0 0  PHQ - 2 Score 0 0 0 0 0  Altered sleeping 0 0 0 0 0  Tired, decreased energy 0 0 0 0 0  Change in appetite 0 0 0 0 0  Feeling  bad or failure about yourself  0 0 0 0 0  Trouble concentrating 0 0 0 0 0  Moving slowly or fidgety/restless 0 0 0 0 0  Suicidal thoughts 0 0  0 0  PHQ-9 Score 0 0  0  0  0   Difficult doing work/chores Not difficult at all Not difficult at all        Data saved with a previous flowsheet row definition    Health Maintenance  Topic Date Due   Diabetic kidney evaluation - Urine ACR  04/04/2024   HEMOGLOBIN A1C  04/04/2024   OPHTHALMOLOGY EXAM  04/23/2024 (Originally 02/07/2023)   COVID-19 Vaccine (3 - 2025-26 season) 05/09/2024 (Originally 11/24/2023)   Influenza Vaccine  06/22/2024 (Originally 10/24/2023)   Zoster Vaccines- Shingrix (1 of 2) 07/22/2024 (Originally 10/21/2014)   Pneumococcal Vaccine: 50+ Years (1 of 2 - PCV)  04/23/2025 (Originally 10/21/1983)   Hepatitis B Vaccines 19-59 Average Risk (1 of 3 - 19+ 3-dose series) 04/23/2025 (Originally 10/21/1983)   Diabetic kidney evaluation - eGFR measurement  10/02/2024   FOOT EXAM  10/02/2024   DTaP/Tdap/Td (2 - Td or Tdap) 11/01/2024   Colonoscopy  02/21/2031   HPV VACCINES (No Doses Required) Completed   Hepatitis C Screening  Completed   HIV Screening  Completed   Meningococcal B Vaccine  Aged Out  Colonoscopy as above in 2022, repeat 10 years. No recent heartburn or meds needed regularly - as needed otc PPI, rare use.  Prostate: does not have family history of prostate cancer The natural history of prostate cancer and ongoing controversy regarding screening and potential treatment outcomes of prostate cancer has been discussed with the patient. The meaning of a false positive PSA and a false negative PSA has been discussed. He indicates understanding of the limitations of this screening test and wishes to proceed with screening PSA testing. Lab Results  Component Value Date   PSA1 1.3 11/18/2019   PSA1 0.8 01/22/2019   PSA1 0.7 11/27/2016   PSA 0.81 08/14/2022   PSA 0.79 10/04/2020   PSA 0.5 11/22/2015    Immunization History  Administered Date(s) Administered   Influenza, Seasonal, Injecte, Preservative Fre 12/13/2022   Influenza,inj,Quad PF,6+ Mos 12/22/2013, 11/22/2015, 11/27/2016, 11/28/2017, 01/22/2019, 11/18/2019, 03/08/2021, 03/21/2022   PFIZER Comirnaty(Gray Top)Covid-19 Tri-Sucrose Vaccine 12/22/2019, 01/12/2020   Tdap 11/02/2014  Flu vaccine today, others declined.   No results found. Optho in December - will request record.   Dental: every 6 months, appt in February  Alcohol: none  Tobacco: none  Exercise: active with loading/unloading truck.    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   Family history of heart disease 10/16/2021   Class 1 obesity without serious comorbidity with body mass index (BMI) of 34.0 to 34.9 in adult 12/22/2016   Essential hypertension 03/11/2012   Mixed hyperlipidemia 03/11/2012   Past Medical History:  Diagnosis Date   Class 1 obesity due to excess calories with serious comorbidity and body mass index (BMI) of 32.0 to 32.9 in adult 12/22/2016   Diabetes mellitus type 2 in obese 10/16/2021   Essential hypertension 03/11/2012   Family history of heart disease 10/16/2021   GERD (gastroesophageal reflux disease)    Hypercholesteremia    Hypertension    Metabolic syndrome 10/16/2021   Mixed hyperlipidemia 03/11/2012  Past Surgical History:  Procedure Laterality Date   APPENDECTOMY     motor vechile     SPINE SURGERY     Cervical spine surgery   TONSILLECTOMY     Allergies[1] Prior to Admission medications  Medication Sig Start Date End Date Taking? Authorizing Provider  amLODipine  (NORVASC ) 5 MG tablet Take 1 tablet (5 mg total) by mouth daily. 10/03/23  Yes Levora Reyes SAUNDERS, MD  aspirin 81 MG chewable tablet Chew 81 mg by mouth daily.   Yes [provider]  atorvastatin  (LIPITOR) 40 MG tablet Take 1 tablet (40 mg total) by mouth daily. 10/03/23  Yes Levora Reyes SAUNDERS, MD  fish oil-omega-3 fatty acids 1000 MG capsule Take 1 g by mouth daily.   Yes [provider]  lisinopril  (ZESTRIL ) 40 MG tablet Take 1 tablet by mouth once daily 01/29/24  Yes Levora Reyes SAUNDERS, MD  LYSINE PO Take 1 tablet by mouth daily.   Yes [provider]  Semaglutide , 2 MG/DOSE, (OZEMPIC , 2 MG/DOSE,) 8 MG/3ML SOPN Inject 2 mg into the skin once a week. 10/03/23  Yes Levora Reyes SAUNDERS, MD  azelastine  (ASTELIN ) 0.1 % nasal spray Place 1-2 sprays into both nostrils 2 (two) times daily as needed for rhinitis. Use in each nostril as directed Patient not taking: Reported on 04/23/2024 08/16/22   Levora Reyes SAUNDERS, MD  traMADol (ULTRAM) 50 MG tablet Take 50 mg by mouth every 4 (four) hours. Patient not taking: Reported on 04/23/2024 04/19/22   [provider]   Social History   Socioeconomic History   Marital status: Single    Spouse name: Not on file   Number of children: 2   Years of education: Not on file   Highest education level: Not on file  Occupational History   Occupation: truck driver    Comment: [employer]  Tobacco Use   Smoking status: Never   Smokeless tobacco: Never  Substance and Sexual Activity   Alcohol use: No   Drug use: No   Sexual activity: Yes  Other Topics Concern   Not on file  Social History Narrative   Marital status: Single; not dating      Children: 2 children/adopted.  No grandchildren.      Education: [employer].      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      Tobacco: none      Alcohol: none      Drugs: none      Exercise:  Weight room in house; lifts weights.      Seatbelt: 100%; no texting      Sexual activity: none in past year; no STDs.  Females only.           Social Drivers of Health   Tobacco Use: Low Risk (04/23/2024)   Patient History    Smoking Tobacco Use: Never    Smokeless Tobacco Use: Never    Passive Exposure: Not on file  Financial Resource Strain: Low Risk (10/16/2021)   Overall Financial Resource Strain (CARDIA)    Difficulty of Paying Living Expenses: Not hard at all  Food Insecurity: No Food Insecurity (10/16/2021)   Hunger Vital Sign    Worried About Running Out of Food in the Last Year: Never true    Ran Out of Food in the Last Year: Never true  Transportation Needs: No Transportation Needs (10/16/2021)   PRAPARE - Transportation    Lack  of Transportation (Medical): No    Lack of Transportation (Non-Medical): No  Physical Activity: Inactive (10/16/2021)   Exercise Vital Sign    Days of Exercise per Week: 0 days    Minutes of Exercise per Session: 0 min   Stress: Not on file  Social Connections: Unknown (08/07/2021)   Received from [facility]   Social Network    Social Network: Not on file  Intimate Partner Violence: Unknown (06/29/2021)   Received from [facility]   HITS    Physically Hurt: Not on file    Insult or Talk Down To: Not on file    Threaten Physical Harm: Not on file    Scream or Curse: Not on file  Depression (PHQ2-9): Low Risk (04/23/2024)   Depression (PHQ2-9)    PHQ-2 Score: 0  Alcohol Screen: Low Risk (10/16/2021)   Alcohol Screen    Last Alcohol Screening Score (AUDIT): 0  Housing: Low Risk (10/16/2021)   Housing    Last Housing Risk Score: 0  Utilities: Not on file  Health Literacy: Not on file    Review of Systems 13 point review of systems per patient health survey noted.  Negative other than as indicated above or in HPI.    Objective:   Vitals:   04/23/24 0957  BP: 110/74  Pulse: 62  Resp: 19  Temp: 98.7 F (37.1 C)  TempSrc: Temporal  SpO2: 98%  Weight: 216 lb (98 kg)  Height: 5' 8 (1.727 m)     Physical Exam Vitals reviewed.  Constitutional:      Appearance: He is well-developed.  HENT:     Head: Normocephalic and atraumatic.     Right Ear: External ear normal.     Left Ear: External ear normal.  Eyes:     Conjunctiva/sclera: Conjunctivae normal.     Pupils: Pupils are equal, round, and reactive to light.  Neck:     Thyroid : No thyromegaly.  Cardiovascular:     Rate and Rhythm: Normal rate and regular rhythm.     Heart sounds: Normal heart sounds.  Pulmonary:     Effort: Pulmonary effort is normal. No respiratory distress.     Breath sounds: Normal breath sounds. No wheezing.  Abdominal:     General: There is no distension.     Palpations: Abdomen is soft.     Tenderness: There is no abdominal tenderness.  Musculoskeletal:        General: No tenderness. Normal range of motion.     Cervical back: Normal range of motion and neck supple.  Lymphadenopathy:     Cervical: No  cervical adenopathy.  Skin:    General: Skin is warm and dry.  Neurological:     Mental Status: He is alert and oriented to person, place, and time.     Deep Tendon Reflexes: Reflexes are normal and symmetric.  Psychiatric:        Behavior: Behavior normal.        Assessment & Plan:  Bryce Bush. is a 59 y.o. male . Annual physical exam  - -anticipatory guidance as below in AVS, screening labs above. Health maintenance items as above in HPI discussed/recommended as applicable.   Essential hypertension - Plan: amLODipine  (NORVASC ) 5 MG tablet, lisinopril  (ZESTRIL ) 40 MG tablet  -  Stable, tolerating current regimen. Medications refilled. Labs pending as above.   Mixed hyperlipidemia - Plan: atorvastatin  (LIPITOR) 40 MG tablet, Comprehensive metabolic panel with GFR, Lipid panel  -  Stable, tolerating current  regimen. Medications refilled. Labs pending as above.   Type 2 diabetes mellitus in patient with obesity (HCC) - Plan: Semaglutide , 2 MG/DOSE, (OZEMPIC , 2 MG/DOSE,) 8 MG/3ML SOPN, Urine Albumin/Creatinine with ratio (send out) [LAB689], Hemoglobin A1c  - Unsure of reason why his meds may not have been filled the past few weeks.  I will send a new prescription but advised to let us  know if any difficulty with having the medication filled and we can check with pharmacy on concerns.  Check labs and adjust plan accordingly.  Tolerating current med regimen.  Needs flu shot - Plan: Flu vaccine trivalent PF, 6mos and older(Flulaval,Afluria,Fluarix,Fluzone)   Meds ordered this encounter  Medications   amLODipine  (NORVASC ) 5 MG tablet    Sig: Take 1 tablet (5 mg total) by mouth daily.    Dispense:  90 tablet    Refill:  1   lisinopril  (ZESTRIL ) 40 MG tablet    Sig: Take 1 tablet (40 mg total) by mouth daily.    Dispense:  90 tablet    Refill:  0   atorvastatin  (LIPITOR) 40 MG tablet    Sig: Take 1 tablet (40 mg total) by mouth daily.    Dispense:  90 tablet    Refill:  1    Semaglutide , 2 MG/DOSE, (OZEMPIC , 2 MG/DOSE,) 8 MG/3ML SOPN    Sig: Inject 2 mg into the skin once a week.    Dispense:  3 mL    Refill:  2   Patient Instructions  Thank you for coming in today. No change in medications at this time.  If you have any difficulty filling your medications know and we can check with your pharmacy.  Make sure your eye doctor and other specialist send a report to me as your primary care provider, especially for the diabetes retinopathy screening or diabetes eye exam which needs to happen once per year.  If there are any concerns on your bloodwork, I will let you know. Take care!  Preventive Care 75-2 Years Old, Male Preventive care refers to lifestyle choices and visits with your health care provider that can promote health and wellness. Preventive care visits are also called wellness exams. What can I expect for my preventive care visit? Counseling During your preventive care visit, your health care provider may ask about your: Medical history, including: Past medical problems. Family medical history. Current health, including: Emotional well-being. Home life and relationship well-being. Sexual activity. Lifestyle, including: Alcohol, nicotine or tobacco, and drug use. Access to firearms. Diet, exercise, and sleep habits. Safety issues such as seatbelt and bike helmet use. Sunscreen use. Work and work astronomer. Physical exam Your health care provider will check your: Height and weight. These may be used to calculate your BMI (body mass index). BMI is a measurement that tells if you are at a healthy weight. Waist circumference. This measures the distance around your waistline. This measurement also tells if you are at a healthy weight and may help predict your risk of certain diseases, such as type 2 diabetes and high blood pressure. Heart rate and blood pressure. Body temperature. Skin for abnormal spots. What immunizations do I need?  Vaccines are  usually given at various ages, according to a schedule. Your health care provider will recommend vaccines for you based on your age, medical history, and lifestyle or other factors, such as travel or where you work. What tests do I need? Screening Your health care provider may recommend screening tests for certain conditions. This may  include: Lipid and cholesterol levels. Diabetes screening. This is done by checking your blood sugar (glucose) after you have not eaten for a while (fasting). Hepatitis B test. Hepatitis C test. HIV (human immunodeficiency virus) test. STI (sexually transmitted infection) testing, if you are at risk. Lung cancer screening. Prostate cancer screening. Colorectal cancer screening. Talk with your health care provider about your test results, treatment options, and if necessary, the need for more tests. Follow these instructions at home: Eating and drinking  Eat a diet that includes fresh fruits and vegetables, whole grains, lean protein, and low-fat dairy products. Take vitamin and mineral supplements as recommended by your health care provider. Do not drink alcohol if your health care provider tells you not to drink. If you drink alcohol: Limit how much you have to 0-2 drinks a day. Know how much alcohol is in your drink. In the [place] one drink equals one 12 oz bottle of beer (355 mL), one 5 oz glass of wine (148 mL), or one 1 oz glass of hard liquor (44 mL). Lifestyle Brush your teeth every morning and night with fluoride toothpaste. Floss one time each day. Exercise for at least 30 minutes 5 or more days each week. Do not use any products that contain nicotine or tobacco. These products include cigarettes, chewing tobacco, and vaping devices, such as e-cigarettes. If you need help quitting, ask your health care provider. Do not use drugs. If you are sexually active, practice safe sex. Use a condom or other form of protection to prevent STIs. Take aspirin  only as told by your health care provider. Make sure that you understand how much to take and what form to take. Work with your health care provider to find out whether it is safe and beneficial for you to take aspirin daily. Find healthy ways to manage stress, such as: Meditation, yoga, or listening to music. Journaling. Talking to a trusted person. Spending time with friends and family. Minimize exposure to UV radiation to reduce your risk of skin cancer. Safety Always wear your seat belt while driving or riding in a vehicle. Do not drive: If you have been drinking alcohol. Do not ride with someone who has been drinking. When you are tired or distracted. While texting. If you have been using any mind-altering substances or drugs. Wear a helmet and other protective equipment during sports activities. If you have firearms in your house, make sure you follow all gun safety procedures. What's next? Go to your health care provider once a year for an annual wellness visit. Ask your health care provider how often you should have your eyes and teeth checked. Stay up to date on all vaccines. This information is not intended to replace advice given to you by your health care provider. Make sure you discuss any questions you have with your health care provider. Document Revised: 09/06/2020 Document Reviewed: 09/06/2020 Elsevier Patient Education  2024 Elsevier Inc.     Signed,   Reyes Pines, MD [facility], [facility] 04/23/24 10:37 AM       [1] No Known Allergies  "

## 2024-04-23 NOTE — Patient Instructions (Signed)
 Thank you for coming in today. No change in medications at this time.  If you have any difficulty filling your medications know and we can check with your pharmacy.  Make sure your eye doctor and other specialist send a report to me as your primary care provider, especially for the diabetes retinopathy screening or diabetes eye exam which needs to happen once per year.  If there are any concerns on your bloodwork, I will let you know. Take care!  Preventive Care 4-76 Years Old, Male Preventive care refers to lifestyle choices and visits with your health care provider that can promote health and wellness. Preventive care visits are also called wellness exams. What can I expect for my preventive care visit? Counseling During your preventive care visit, your health care provider may ask about your: Medical history, including: Past medical problems. Family medical history. Current health, including: Emotional well-being. Home life and relationship well-being. Sexual activity. Lifestyle, including: Alcohol, nicotine or tobacco, and drug use. Access to firearms. Diet, exercise, and sleep habits. Safety issues such as seatbelt and bike helmet use. Sunscreen use. Work and work astronomer. Physical exam Your health care provider will check your: Height and weight. These may be used to calculate your BMI (body mass index). BMI is a measurement that tells if you are at a healthy weight. Waist circumference. This measures the distance around your waistline. This measurement also tells if you are at a healthy weight and may help predict your risk of certain diseases, such as type 2 diabetes and high blood pressure. Heart rate and blood pressure. Body temperature. Skin for abnormal spots. What immunizations do I need?  Vaccines are usually given at various ages, according to a schedule. Your health care provider will recommend vaccines for you based on your age, medical history, and lifestyle or  other factors, such as travel or where you work. What tests do I need? Screening Your health care provider may recommend screening tests for certain conditions. This may include: Lipid and cholesterol levels. Diabetes screening. This is done by checking your blood sugar (glucose) after you have not eaten for a while (fasting). Hepatitis B test. Hepatitis C test. HIV (human immunodeficiency virus) test. STI (sexually transmitted infection) testing, if you are at risk. Lung cancer screening. Prostate cancer screening. Colorectal cancer screening. Talk with your health care provider about your test results, treatment options, and if necessary, the need for more tests. Follow these instructions at home: Eating and drinking  Eat a diet that includes fresh fruits and vegetables, whole grains, lean protein, and low-fat dairy products. Take vitamin and mineral supplements as recommended by your health care provider. Do not drink alcohol if your health care provider tells you not to drink. If you drink alcohol: Limit how much you have to 0-2 drinks a day. Know how much alcohol is in your drink. In the [place] one drink equals one 12 oz bottle of beer (355 mL), one 5 oz glass of wine (148 mL), or one 1 oz glass of hard liquor (44 mL). Lifestyle Brush your teeth every morning and night with fluoride toothpaste. Floss one time each day. Exercise for at least 30 minutes 5 or more days each week. Do not use any products that contain nicotine or tobacco. These products include cigarettes, chewing tobacco, and vaping devices, such as e-cigarettes. If you need help quitting, ask your health care provider. Do not use drugs. If you are sexually active, practice safe sex. Use a condom or other form  of protection to prevent STIs. Take aspirin only as told by your health care provider. Make sure that you understand how much to take and what form to take. Work with your health care provider to find out whether  it is safe and beneficial for you to take aspirin daily. Find healthy ways to manage stress, such as: Meditation, yoga, or listening to music. Journaling. Talking to a trusted person. Spending time with friends and family. Minimize exposure to UV radiation to reduce your risk of skin cancer. Safety Always wear your seat belt while driving or riding in a vehicle. Do not drive: If you have been drinking alcohol. Do not ride with someone who has been drinking. When you are tired or distracted. While texting. If you have been using any mind-altering substances or drugs. Wear a helmet and other protective equipment during sports activities. If you have firearms in your house, make sure you follow all gun safety procedures. What's next? Go to your health care provider once a year for an annual wellness visit. Ask your health care provider how often you should have your eyes and teeth checked. Stay up to date on all vaccines. This information is not intended to replace advice given to you by your health care provider. Make sure you discuss any questions you have with your health care provider. Document Revised: 09/06/2020 Document Reviewed: 09/06/2020 Elsevier Patient Education  2024 [employer].

## 2024-04-25 ENCOUNTER — Ambulatory Visit: Payer: Self-pay | Admitting: Family Medicine

## 2024-10-14 ENCOUNTER — Ambulatory Visit: Admitting: Family Medicine
